# Patient Record
Sex: Female | Born: 1940 | Race: Black or African American | Hispanic: No | Smoking: Never smoker
Health system: Southern US, Community
[De-identification: ages and names within clinical notes are randomized; demographics above are authoritative.]

## PROBLEM LIST (undated history)

## (undated) DIAGNOSIS — C801 Malignant (primary) neoplasm, unspecified: Secondary | ICD-10-CM

## (undated) HISTORY — PX: CYST EXCISION: SHX5701

---

## 1999-08-03 ENCOUNTER — Emergency Department (HOSPITAL_COMMUNITY): Admission: EM | Admit: 1999-08-03 | Discharge: 1999-08-03 | Payer: Self-pay | Admitting: Emergency Medicine

## 1999-08-03 ENCOUNTER — Encounter: Payer: Self-pay | Admitting: Emergency Medicine

## 2001-06-05 ENCOUNTER — Ambulatory Visit (HOSPITAL_COMMUNITY): Admission: RE | Admit: 2001-06-05 | Discharge: 2001-06-05 | Payer: Self-pay | Admitting: Radiology

## 2006-05-10 ENCOUNTER — Ambulatory Visit (HOSPITAL_COMMUNITY): Admission: RE | Admit: 2006-05-10 | Discharge: 2006-05-10 | Payer: Self-pay | Admitting: Internal Medicine

## 2007-07-11 ENCOUNTER — Emergency Department (HOSPITAL_COMMUNITY): Admission: EM | Admit: 2007-07-11 | Discharge: 2007-07-11 | Payer: Self-pay | Admitting: Emergency Medicine

## 2008-05-11 ENCOUNTER — Ambulatory Visit (HOSPITAL_COMMUNITY): Admission: RE | Admit: 2008-05-11 | Discharge: 2008-05-11 | Payer: Self-pay | Admitting: Ophthalmology

## 2010-05-09 ENCOUNTER — Ambulatory Visit (HOSPITAL_COMMUNITY): Payer: Medicare Other

## 2010-05-09 ENCOUNTER — Ambulatory Visit (HOSPITAL_COMMUNITY)
Admission: RE | Admit: 2010-05-09 | Discharge: 2010-05-09 | Disposition: A | Discharge status: Home / Self Care | Payer: Medicare Other | Source: Ambulatory Visit | Attending: Ophthalmology | Admitting: Ophthalmology

## 2010-05-09 DIAGNOSIS — J45909 Unspecified asthma, uncomplicated: Secondary | ICD-10-CM | POA: Insufficient documentation

## 2010-05-09 DIAGNOSIS — H35349 Macular cyst, hole, or pseudohole, unspecified eye: Secondary | ICD-10-CM | POA: Insufficient documentation

## 2010-05-09 DIAGNOSIS — Z538 Procedure and treatment not carried out for other reasons: Secondary | ICD-10-CM | POA: Insufficient documentation

## 2010-05-09 LAB — CBC
HCT: 37.2 % (ref 36.0–46.0)
Hemoglobin: 12.1 g/dL (ref 12.0–15.0)
MCHC: 32.5 g/dL (ref 30.0–36.0)
RBC: 4.32 MIL/uL (ref 3.87–5.11)
WBC: 4.6 10*3/uL (ref 4.0–10.5)

## 2010-05-09 LAB — BASIC METABOLIC PANEL
CO2: 29 mEq/L (ref 19–32)
Calcium: 9.5 mg/dL (ref 8.4–10.5)
GFR calc Af Amer: >60 mL/min (ref >60)
Glucose, Bld: 93 mg/dL (ref 70–99)
Potassium: 4.1 mEq/L (ref 3.5–5.1)
Sodium: 143 mEq/L (ref 135–145)

## 2010-05-09 LAB — SURGICAL PCR SCREEN: MRSA, PCR: POSITIVE — AB

## 2010-06-16 LAB — CREATININE, SERUM
Creatinine, Ser: 0.7 mg/dL (ref 0.4–1.2)
GFR calc Af Amer: >60 mL/min (ref >60)

## 2010-07-11 ENCOUNTER — Ambulatory Visit (HOSPITAL_COMMUNITY)
Admission: RE | Admit: 2010-07-11 | Discharge: 2010-07-11 | Disposition: A | Discharge status: Home / Self Care | Payer: Medicare Other | Source: Ambulatory Visit | Attending: Ophthalmology | Admitting: Ophthalmology

## 2012-05-20 DIAGNOSIS — C189 Malignant neoplasm of colon, unspecified: Secondary | ICD-10-CM | POA: Diagnosis present

## 2013-04-02 ENCOUNTER — Observation Stay (HOSPITAL_COMMUNITY)
Admission: EM | Admit: 2013-04-02 | Discharge: 2013-04-03 | Disposition: A | Discharge status: Home / Self Care | Payer: Medicare Other | Attending: Internal Medicine | Admitting: Internal Medicine

## 2013-04-02 ENCOUNTER — Emergency Department (HOSPITAL_COMMUNITY): Payer: Medicare Other

## 2013-04-02 ENCOUNTER — Encounter (HOSPITAL_COMMUNITY): Payer: Self-pay | Admitting: Emergency Medicine

## 2013-04-02 DIAGNOSIS — Z86718 Personal history of other venous thrombosis and embolism: Secondary | ICD-10-CM

## 2013-04-02 DIAGNOSIS — R072 Precordial pain: Principal | ICD-10-CM | POA: Insufficient documentation

## 2013-04-02 DIAGNOSIS — C787 Secondary malignant neoplasm of liver and intrahepatic bile duct: Secondary | ICD-10-CM

## 2013-04-02 DIAGNOSIS — R0602 Shortness of breath: Secondary | ICD-10-CM | POA: Insufficient documentation

## 2013-04-02 DIAGNOSIS — R079 Chest pain, unspecified: Secondary | ICD-10-CM

## 2013-04-02 DIAGNOSIS — I82409 Acute embolism and thrombosis of unspecified deep veins of unspecified lower extremity: Secondary | ICD-10-CM

## 2013-04-02 DIAGNOSIS — C189 Malignant neoplasm of colon, unspecified: Secondary | ICD-10-CM

## 2013-04-02 DIAGNOSIS — Z79899 Other long term (current) drug therapy: Secondary | ICD-10-CM | POA: Insufficient documentation

## 2013-04-02 DIAGNOSIS — Z7901 Long term (current) use of anticoagulants: Secondary | ICD-10-CM

## 2013-04-02 DIAGNOSIS — K224 Dyskinesia of esophagus: Secondary | ICD-10-CM

## 2013-04-02 HISTORY — DX: Malignant (primary) neoplasm, unspecified: C80.1

## 2013-04-02 LAB — POCT I-STAT TROPONIN I: Troponin i, poc: 0 ng/mL (ref 0.00–0.08)

## 2013-04-02 LAB — CBC
HEMATOCRIT: 28.8 % — AB (ref 36.0–46.0)
HEMOGLOBIN: 9.4 g/dL — AB (ref 12.0–15.0)
MCH: 28.8 pg (ref 26.0–34.0)
MCHC: 32.6 g/dL (ref 30.0–36.0)
MCV: 88.3 fL (ref 78.0–100.0)
Platelets: 246 10*3/uL (ref 150–400)
RBC: 3.26 MIL/uL — ABNORMAL LOW (ref 3.87–5.11)
RDW: 14.8 % (ref 11.5–15.5)
WBC: 2.7 10*3/uL — ABNORMAL LOW (ref 4.0–10.5)

## 2013-04-02 NOTE — ED Provider Notes (Signed)
CSN: [id number]     Arrival date & time 04/02/13  2239 History   First MD Initiated Contact with Patient 04/02/13 2249     Chief Complaint  Patient presents with  . Chest Pain  . Shortness of Breath   (Consider location/radiation/quality/duration/timing/severity/associated sxs/prior Treatment) The history is provided by the patient and medical records. No language interpreter was used.    Erica Lowe is a 73 y.o. female  with a hx of colon cancer presents to the Emergency Department complaining of gradual, persistent, progressively worsening substernal chest pain and epigastric discomfort described and "gas" onset 1 hour ago.  She reports she went to chemo on Monday (chemo runs Mon-Wed) and her dose was increased this round. Associated symptoms include SOB.  EMS gave ASA and nitro with complete relief of pain and SOB.  Pt reports that walking makes her CP worse and rest makes it better.  Pt denies fever, chills, headache, nausea, vomiting, weakness, dizziness, syncope.  Pt reports that she had a similar episode of CP the last time her chemo was increased. Pt with Hx of DVT last year for which she is on Xarelto.  Pt denies personal or family cardiac hx.    Past Medical History  Diagnosis Date  . Cancer    Past Surgical History  Procedure Laterality Date  . Cyst excision     No family history on file. History  Substance Use Topics  . Smoking status: Never Smoker   . Smokeless tobacco: Not on file  . Alcohol Use: No   OB History   Grav Para Term Preterm Abortions TAB SAB Ect Mult Living                 Review of Systems  Constitutional: Negative for fever, diaphoresis, appetite change, fatigue and unexpected weight change.  HENT: Negative for mouth sores.   Eyes: Negative for visual disturbance.  Respiratory: Positive for shortness of breath. Negative for cough, chest tightness and wheezing.   Cardiovascular: Positive for chest pain.  Gastrointestinal: Negative for nausea,  vomiting, abdominal pain, diarrhea and constipation.  Endocrine: Negative for polydipsia, polyphagia and polyuria.  Genitourinary: Negative for dysuria, urgency, frequency and hematuria.  Musculoskeletal: Negative for back pain and neck stiffness.  Skin: Negative for rash.  Allergic/Immunologic: Negative for immunocompromised state.  Neurological: Negative for syncope, light-headedness and headaches.  Hematological: Does not bruise/bleed easily.  Psychiatric/Behavioral: Negative for sleep disturbance. The patient is not nervous/anxious.     Allergies  Review of patient's allergies indicates no known allergies.  Home Medications   Current Outpatient Rx  Name  Route  Sig  Dispense  Refill  . Ascorbic Acid (VITAMIN C PO)   Oral   Take 1 tablet by mouth daily.         . Cyanocobalamin (VITAMIN B-12 PO)   Oral   Take 1 tablet by mouth daily.         . IRON PO   Oral   Take 1 tablet by mouth daily.         . Omega-3 Fatty Acids (FISH OIL PO)   Oral   Take 1 capsule by mouth daily.         . Rivaroxaban (XARELTO) 20 MG TABS tablet   Oral   Take 20 mg by mouth daily with supper.          BP 126/75  Pulse 53  Temp(Src) 98.2 F (36.8 C) (Oral)  Resp 15  Ht 5\' 9"  (1.753  m)  Wt 137 lb (62.143 kg)  BMI 20.22 kg/m2  SpO2 100% Physical Exam  Nursing note and vitals reviewed. Constitutional: She is oriented to person, place, and time. She appears well-developed and well-nourished. No distress.  Awake, alert, nontoxic appearance  HENT:  Head: Normocephalic and atraumatic.  Mouth/Throat: Oropharynx is clear and moist. No oropharyngeal exudate.  Eyes: Conjunctivae are normal. Pupils are equal, round, and reactive to light. No scleral icterus.  Neck: Normal range of motion. Neck supple.  Cardiovascular: Normal rate, regular rhythm, normal heart sounds and intact distal pulses.   No murmur heard. No tachycardia  Pulmonary/Chest: Effort normal and breath sounds normal.  No respiratory distress. She has no wheezes.  Abdominal: Soft. Bowel sounds are normal. She exhibits no mass. There is no tenderness. There is no rebound and no guarding.  Musculoskeletal: Normal range of motion. She exhibits no edema.  Lymphadenopathy:    She has no cervical adenopathy.  Neurological: She is alert and oriented to person, place, and time. She exhibits normal muscle tone. Coordination normal.  Speech is clear and goal oriented Moves extremities without ataxia  Skin: Skin is warm and dry. She is not diaphoretic. No erythema.  Psychiatric: She has a normal mood and affect.    ED Course  Procedures (including critical care time) Labs Review Labs Reviewed  CBC - Abnormal; Notable for the following:    WBC 2.7 (*)    RBC 3.26 (*)    Hemoglobin 9.4 (*)    HCT 28.8 (*)    All other components within normal limits  COMPREHENSIVE METABOLIC PANEL - Abnormal; Notable for the following:    Sodium 136 (*)    Glucose, Bld 102 (*)    Albumin 3.0 (*)    Alkaline Phosphatase 125 (*)    GFR calc non Af Amer 65 (*)    GFR calc Af Amer 75 (*)    All other components within normal limits  URINALYSIS, ROUTINE W REFLEX MICROSCOPIC - Abnormal; Notable for the following:    APPearance CLOUDY (*)    Leukocytes, UA SMALL (*)    All other components within normal limits  URINE MICROSCOPIC-ADD ON - Abnormal; Notable for the following:    Squamous Epithelial / LPF FEW (*)    Bacteria, UA FEW (*)    All other components within normal limits  URINE CULTURE  POCT I-STAT TROPONIN I   Imaging Review Dg Chest 2 View  04/03/2013   CLINICAL DATA:  Lower chest pain, shortness of breath  EXAM: CHEST  2 VIEW  COMPARISON:  05/09/2010  FINDINGS: Right IJ double lumen power port catheter tip mid SVC. Normal heart size and vascularity. No acute pneumonia, edema, collapse or consolidation. No effusion or pneumothorax. Trachea midline.  IMPRESSION: No active cardiopulmonary disease.   Electronically  Signed   By: Daryll Brod M.D.   On: 04/03/2013 00:20   Ct Angio Chest Pe W/cm &/or Wo Cm  04/03/2013   CLINICAL DATA:  Shortness of breath, colon cancer, chest pain  EXAM: CT ANGIOGRAPHY CHEST WITH CONTRAST  TECHNIQUE: Multidetector CT imaging of the chest was performed using the standard protocol during bolus administration of intravenous contrast. Multiplanar CT image reconstructions including MIPs were obtained to evaluate the vascular anatomy.  CONTRAST:  7mL OMNIPAQUE IOHEXOL 350 MG/ML SOLN  COMPARISON:  04/02/2013 chest x-ray  FINDINGS: Pulmonary arteries demonstrate no filling defect or significant pulmonary embolus by CTA. No thoracic aneurysm. Negative for adenopathy. Right IJ double-lumen port catheter tip  in the SVC. Normal heart size. No pericardial or pleural effusion.  Lung windows demonstrate mild emphysema. No focal airspace process, collapse or consolidation. No interstitial process or edema. No pneumothorax. Trachea and central airways are patent. Left lower lobe posterior subpleural 10 mm nodule noted, image 74. This remains indeterminate. Minor basilar atelectasis.  Included upper abdomen demonstrates hypodense lesions throughout the liver compatible with metastatic disease. Left adrenal mass noted, compatible with an adrenal metastasis.  Minor degenerative changes of the spine.  No compression fracture.  Review of the MIP images confirms the above findings.  IMPRESSION: No significant acute pulmonary embolus by CTA.  No acute intra thoracic finding  Hyperinflation with mild emphysema  10 mm posterior subpleural left lower lobe nodule, indeterminate but suspicious for a small pulmonary metastasis.  Partial imaging of hypodense hepatic lesions and a left adrenal mass concerning for metastatic disease in the upper abdomen.   Electronically Signed   By: Daryll Brod M.D.   On: 04/03/2013 01:15    EKG Interpretation   None       MDM   1. Chest pain   2. Colon cancer   3. History  of DVT (deep vein thrombosis)   4. Anticoagulant long-term use     MOSLEY STITZ presents with exertional CP onset 1 hour PTA.  Pt with active colon cancer and Hx of DVT currently on Xarelto.  Pt without personal or family cardiac Hx.    12:48 AM Initial troponin negative, CBC and CMP unremarkable.  CT angio chest pending to rule out PE.  Anticipate admission for cardiac rule out.    1:54 AM Pt without evidence of PE.  Noted likely mets to the liver and lungs.  Will proceed with admission for cardiac rule out.  Discussed with Dr. Posey Pronto of [facility].         Jarrett Soho Edwar Coe, PA-C 04/03/13 [phone]

## 2013-04-02 NOTE — ED Notes (Signed)
Pt aware she needs to give urine sample

## 2013-04-02 NOTE — ED Notes (Signed)
Per ems, pt from home, about 2130 pt had central chest pain, described it as "tiring" that was centrally located. 9/10 pain Denies radiating anywhere. Also c/o SOB. Pt has hx of colon cancer, receieved chemo this morning, states she had a similar episode like this last time she had chemo. Pt was given 1 nitro, 324 asa, currently denies pain or SOB at this time. Pt AAOx4, denies pain, denies SOB. Denies n/v/d. VSS

## 2013-04-03 ENCOUNTER — Emergency Department (HOSPITAL_COMMUNITY): Payer: Medicare Other

## 2013-04-03 ENCOUNTER — Encounter (HOSPITAL_COMMUNITY): Payer: Self-pay | Admitting: Radiology

## 2013-04-03 DIAGNOSIS — C787 Secondary malignant neoplasm of liver and intrahepatic bile duct: Secondary | ICD-10-CM | POA: Diagnosis present

## 2013-04-03 DIAGNOSIS — K224 Dyskinesia of esophagus: Secondary | ICD-10-CM

## 2013-04-03 DIAGNOSIS — R079 Chest pain, unspecified: Secondary | ICD-10-CM

## 2013-04-03 DIAGNOSIS — Z86718 Personal history of other venous thrombosis and embolism: Secondary | ICD-10-CM

## 2013-04-03 DIAGNOSIS — I82409 Acute embolism and thrombosis of unspecified deep veins of unspecified lower extremity: Secondary | ICD-10-CM | POA: Diagnosis present

## 2013-04-03 DIAGNOSIS — C189 Malignant neoplasm of colon, unspecified: Secondary | ICD-10-CM

## 2013-04-03 LAB — COMPREHENSIVE METABOLIC PANEL
ALBUMIN: 2.9 g/dL — AB (ref 3.5–5.2)
ALK PHOS: 125 U/L — AB (ref 39–117)
ALK PHOS: 123 U/L — AB (ref 39–117)
ALT: 14 U/L (ref 0–35)
ALT: 13 U/L (ref 0–35)
AST: 22 U/L (ref 0–37)
AST: 19 U/L (ref 0–37)
Albumin: 3 g/dL — ABNORMAL LOW (ref 3.5–5.2)
BILIRUBIN TOTAL: 0.3 mg/dL (ref 0.3–1.2)
BUN: 15 mg/dL (ref 6–23)
BUN: 15 mg/dL (ref 6–23)
CALCIUM: 9.3 mg/dL (ref 8.4–10.5)
CO2: 26 meq/L (ref 19–32)
CO2: 28 mEq/L (ref 19–32)
CREATININE: 0.88 mg/dL (ref 0.50–1.10)
Calcium: 9.4 mg/dL (ref 8.4–10.5)
Chloride: 97 mEq/L (ref 96–112)
Chloride: 101 mEq/L (ref 96–112)
Creatinine, Ser: 0.87 mg/dL (ref 0.50–1.10)
GFR calc Af Amer: 74 mL/min — ABNORMAL LOW (ref >90)
GFR calc non Af Amer: 64 mL/min — ABNORMAL LOW (ref >90)
GFR, EST AFRICAN AMERICAN: 75 mL/min — AB (ref >90)
GFR, EST NON AFRICAN AMERICAN: 65 mL/min — AB (ref >90)
GLUCOSE: 102 mg/dL — AB (ref 70–99)
GLUCOSE: 103 mg/dL — AB (ref 70–99)
POTASSIUM: 3.8 meq/L (ref 3.7–5.3)
Potassium: 4.1 mEq/L (ref 3.7–5.3)
Sodium: 136 mEq/L — ABNORMAL LOW (ref 137–147)
Sodium: 142 mEq/L (ref 137–147)
TOTAL PROTEIN: 7 g/dL (ref 6.0–8.3)
Total Bilirubin: 0.3 mg/dL (ref 0.3–1.2)
Total Protein: 6.9 g/dL (ref 6.0–8.3)

## 2013-04-03 LAB — URINE MICROSCOPIC-ADD ON

## 2013-04-03 LAB — URINALYSIS, ROUTINE W REFLEX MICROSCOPIC
Bilirubin Urine: NEGATIVE
Glucose, UA: NEGATIVE mg/dL
Hgb urine dipstick: NEGATIVE
KETONES UR: NEGATIVE mg/dL
NITRITE: NEGATIVE
PH: 7 (ref 5.0–8.0)
PROTEIN: NEGATIVE mg/dL
Specific Gravity, Urine: 1.007 (ref 1.005–1.030)
UROBILINOGEN UA: 0.2 mg/dL (ref 0.0–1.0)

## 2013-04-03 LAB — CBC WITH DIFFERENTIAL/PLATELET
BASOS ABS: 0 10*3/uL (ref 0.0–0.1)
BASOS PCT: 0 % (ref 0–1)
Eosinophils Absolute: 0.1 10*3/uL (ref 0.0–0.7)
Eosinophils Relative: 2 % (ref 0–5)
HEMATOCRIT: 28.7 % — AB (ref 36.0–46.0)
HEMOGLOBIN: 9.3 g/dL — AB (ref 12.0–15.0)
LYMPHS ABS: 1.5 10*3/uL (ref 0.7–4.0)
LYMPHS PCT: 65 % — AB (ref 12–46)
MCH: 28.9 pg (ref 26.0–34.0)
MCHC: 32.4 g/dL (ref 30.0–36.0)
MCV: 89.1 fL (ref 78.0–100.0)
MONO ABS: 0.1 10*3/uL (ref 0.1–1.0)
Monocytes Relative: 2 % — ABNORMAL LOW (ref 3–12)
NEUTROS ABS: 0.8 10*3/uL — AB (ref 1.7–7.7)
Neutrophils Relative %: 31 % — ABNORMAL LOW (ref 43–77)
Platelets: 253 10*3/uL (ref 150–400)
RBC: 3.22 MIL/uL — ABNORMAL LOW (ref 3.87–5.11)
RDW: 15 % (ref 11.5–15.5)
WBC: 2.5 10*3/uL — ABNORMAL LOW (ref 4.0–10.5)

## 2013-04-03 LAB — MRSA PCR SCREENING: MRSA by PCR: NEGATIVE

## 2013-04-03 LAB — TROPONIN I: Troponin I: <0.3 ng/mL (ref <0.30)

## 2013-04-03 LAB — POCT I-STAT TROPONIN I: Troponin i, poc: 0.01 ng/mL (ref 0.00–0.08)

## 2013-04-03 MED ORDER — RIVAROXABAN 20 MG PO TABS
20.0000 mg | ORAL_TABLET | Freq: Every day | ORAL | Status: DC
  Filled 2013-04-03: qty 1

## 2013-04-03 MED ORDER — IOHEXOL 350 MG/ML SOLN
80.0000 mL | Freq: Once | INTRAVENOUS | Status: AC | PRN
  Administered 2013-04-03: 80 mL via INTRAVENOUS

## 2013-04-03 MED ORDER — ONDANSETRON HCL 4 MG/2ML IJ SOLN: 4.0000 mg | Freq: Four times a day (QID) | INTRAMUSCULAR | Status: DC | PRN

## 2013-04-03 MED ORDER — GI COCKTAIL ~~LOC~~: 30.0000 mL | Freq: Four times a day (QID) | ORAL | Status: DC | PRN

## 2013-04-03 MED ORDER — ONDANSETRON HCL 4 MG/2ML IJ SOLN: 4.0000 mg | Freq: Three times a day (TID) | INTRAMUSCULAR | Status: AC | PRN

## 2013-04-03 MED ORDER — ACETAMINOPHEN 325 MG PO TABS: 650.0000 mg | ORAL_TABLET | ORAL | Status: DC | PRN

## 2013-04-03 MED ORDER — NITROGLYCERIN 0.4 MG/SPRAY TL SOLN: 1.0000 | Status: AC | PRN

## 2013-04-03 NOTE — Consult Note (Signed)
CONSULT NOTE  Date: 04/03/2013               Patient Name:  Erica Lowe MRN: [id number]  DOB: [birth year] Age / Sex: 73 y.o., female        PCP: Salena Saner Primary Cardiologist: New/ Kimbley Sprague            Referring Physician: Eulogio Bear, MD              Reason for Consult:  chest discomfort following chemotherapy            History of Present Illness: Patient is a 73 y.o. female with a PMHx of colon cancer with metastasis to liver, who was admitted to [facility] on 04/02/2013 for evaluation of chest discomfort.   Erica Lowe is a very pleasant 73 year old female.  She was diagnosed with metastatic colon cancer in March, 2013.  She is seen by the doctors in [place] at [facility] so we do not have many records of her past medical history.  She has never had any cardiac problems. Before being diagnosed with colon cancer she typically would walk 2 miles a day and was extremely healthy. She was diagnosed with colon cancer and started chemotherapy. She's had some palpitations with chemotherapy in the past and on several occasions has had chest discomfort/indigestion following chemotherapy.  She feels much better this morning. She's eating breakfast without complications.  Medications: Outpatient medications: Prescriptions prior to admission  Medication Sig Dispense Refill  . Ascorbic Acid (VITAMIN C PO) Take 1 tablet by mouth daily.      . Cyanocobalamin (VITAMIN B-12 PO) Take 1 tablet by mouth daily.      . IRON PO Take 1 tablet by mouth daily.      . Omega-3 Fatty Acids (FISH OIL PO) Take 1 capsule by mouth daily.      . Rivaroxaban (XARELTO) 20 MG TABS tablet Take 20 mg by mouth daily with supper.        Current medications: Current Facility-Administered Medications  Medication Dose Route Frequency Provider Last Rate Last Dose  . acetaminophen (TYLENOL) tablet 650 mg  650 mg Oral Q4H PRN Berle Mull, MD      . gi cocktail (Maalox,Lidocaine,Donnatal)  30 mL  Oral QID PRN Berle Mull, MD      . ondansetron [facility]) injection 4 mg  4 mg Intravenous Q8H PRN Hannah Muthersbaugh, PA-C      . ondansetron (ZOFRAN) injection 4 mg  4 mg Intravenous Q6H PRN Berle Mull, MD      . Rivaroxaban (XARELTO) tablet 20 mg  20 mg Oral Q supper Berle Mull, MD         No Known Allergies   Past Medical History  Diagnosis Date  . Cancer     Past Surgical History  Procedure Laterality Date  . Cyst excision      No family history on file.  Social History:  reports that she has never smoked. She does not have any smokeless tobacco history on file. She reports that she does not drink alcohol. Her drug history is not on file.   Review of Systems: Constitutional:  denies fever, chills, diaphoresis,   She does have decreased appetite, weight loss and fatigue.  HEENT: denies photophobia, eye pain, redness, hearing loss, ear pain, congestion, sore throat, rhinorrhea, sneezing, neck pain, neck stiffness and tinnitus.  Respiratory: denies SOB, DOE, cough, chest tightness,   Cardiovascular: admits to chest pain,  Gastrointestinal: admits to nausea, vomiting, abdominal pain, diarrhea,   Genitourinary: denies dysuria, urgency, frequency, hematuria, flank pain and difficulty urinating.  Musculoskeletal: denies  myalgias, back pain, joint swelling, arthralgias and gait problem.   Skin: denies pallor, rash and wound.  Neurological: denies dizziness, seizures, syncope, weakness, light-headedness, numbness and headaches.   Hematological: denies adenopathy, easy bruising, personal or family bleeding history.  Psychiatric/ Behavioral: denies suicidal ideation, mood changes, confusion, nervousness, sleep disturbance and agitation.    Physical Exam: BP 97/69  Pulse 70  Temp(Src) 98.7 F (37.1 C) (Oral)  Resp 18  Ht 5\' 9"  (1.753 m)  Wt 134 lb 4.8 oz (60.918 kg)  BMI 19.82 kg/m2  SpO2 100%  General: Vital signs reviewed and noted. Well-developed, well-nourished,  in no acute distress; alert,   Head: Normocephalic, atraumatic, sclera anicteric,   Neck: Supple. Negative for carotid bruits. No JVD ,  She has a Port-a-cath in her right subclavian area.  Lungs:  Clear bilaterally, no  wheezes, rales, or rhonchi. Breathing is normal   Heart: RRR with S1 S2. No murmurs, rubs, or gallops   Abdomen:  Soft, non-tender, non-distended with normoactive bowel sounds. No hepatomegaly. No rebound/guarding. No obvious abdominal masses   MSK: Strength and the appear normal for age.   Extremities: No clubbing or cyanosis. No edema.  Distal pedal pulses are 2+ and equal   Neurologic: Alert and oriented X 3. Moves all extremities spontaneously.  Psych: Responds to questions appropriately with a normal affect.     Lab results: Basic Metabolic Panel:  Recent Labs Lab 04/02/13 2315  NA 136*  K 3.8  CL 97  CO2 26  GLUCOSE 102*  BUN 15  CREATININE 0.87  CALCIUM 9.4    Liver Function Tests:  Recent Labs Lab 04/02/13 2315  AST 22  ALT 14  ALKPHOS 125*  BILITOT 0.3  PROT 7.0  ALBUMIN 3.0*   No results found for this basename: LIPASE, AMYLASE,  in the last 168 hours No results found for this basename: AMMONIA,  in the last 168 hours  CBC:  Recent Labs Lab 04/02/13 2315  WBC 2.7*  HGB 9.4*  HCT 28.8*  MCV 88.3  PLT 246    Cardiac Enzymes:  Recent Labs Lab 04/03/13 0255  TROPONINI <0.30    BNP: No components found with this basename: POCBNP,   CBG: No results found for this basename: GLUCAP,  in the last 168 hours  Coagulation Studies: No results found for this basename: LABPROT, INR,  in the last 72 hours   Other results:  EKG NSR, no ST or T wave change   Imaging: Dg Chest 2 View  04/03/2013   CLINICAL DATA:  Lower chest pain, shortness of breath  EXAM: CHEST  2 VIEW  COMPARISON:  05/09/2010  FINDINGS: Right IJ double lumen power port catheter tip mid SVC. Normal heart size and vascularity. No acute pneumonia, edema, collapse  or consolidation. No effusion or pneumothorax. Trachea midline.  IMPRESSION: No active cardiopulmonary disease.   Electronically Signed   By: Daryll Brod M.D.   On: 04/03/2013 00:20   Ct Angio Chest Pe W/cm &/or Wo Cm  04/03/2013   CLINICAL DATA:  Shortness of breath, colon cancer, chest pain  EXAM: CT ANGIOGRAPHY CHEST WITH CONTRAST  TECHNIQUE: Multidetector CT imaging of the chest was performed using the standard protocol during bolus administration of intravenous contrast. Multiplanar CT image reconstructions including MIPs were obtained to evaluate the vascular anatomy.  CONTRAST:  72mL  OMNIPAQUE IOHEXOL 350 MG/ML SOLN  COMPARISON:  04/02/2013 chest x-ray  FINDINGS: Pulmonary arteries demonstrate no filling defect or significant pulmonary embolus by CTA. No thoracic aneurysm. Negative for adenopathy. Right IJ double-lumen port catheter tip in the SVC. Normal heart size. No pericardial or pleural effusion.  Lung windows demonstrate mild emphysema. No focal airspace process, collapse or consolidation. No interstitial process or edema. No pneumothorax. Trachea and central airways are patent. Left lower lobe posterior subpleural 10 mm nodule noted, image 74. This remains indeterminate. Minor basilar atelectasis.  Included upper abdomen demonstrates hypodense lesions throughout the liver compatible with metastatic disease. Left adrenal mass noted, compatible with an adrenal metastasis.  Minor degenerative changes of the spine.  No compression fracture.  Review of the MIP images confirms the above findings.  IMPRESSION: No significant acute pulmonary embolus by CTA.  No acute intra thoracic finding  Hyperinflation with mild emphysema  10 mm posterior subpleural left lower lobe nodule, indeterminate but suspicious for a small pulmonary metastasis.  Partial imaging of hypodense hepatic lesions and a left adrenal mass concerning for metastatic disease in the upper abdomen.   Electronically Signed   By: Daryll Brod  M.D.   On: 04/03/2013 01:15       Assessment & Plan:  1. Chest pain: The patient has very atypical chest pains. The episode sounded more like indigestion following her chemotherapy treatment. She had a very similar episode when she received chemotherapy 2 months ago. She has a normal EKG. Her troponin levels were normal. The pains have completely resolved this morning.  I suspect that these chest pains are due to  her chemotherapy.  Prior to her diagnosis of colon cancer she was very active and walks 2 miles a day without palpitations.  She's not having any ongoing pain and has no EKG changes. Given the fact that she has metastatic colon cancer, am not sure that further cardiac workup would change her prognosis. Certainly, if she starts having chest pain and significant EKG   changes consistent with ischemia then we could perform a cath and angioplasty but I don't think that further evaluation and cardiac catheterization at this point would necessarily change our approach to her care.  2. Metastatic colon cancer: The patient sees the doctors at [facility]. Continue to followup with him.  We'll sign off. Please call for further questions.  Thayer Headings, Brooke Bonito., MD, [facility] 04/03/2013, 8:43 AM Office - [phone] Pager [phone]

## 2013-04-03 NOTE — ED Provider Notes (Signed)
Medical screening examination/treatment/procedure(s) were conducted as a shared visit with non-physician practitioner(s) and myself.  I personally evaluated the patient during the encounter.  EKG Interpretation    Date/Time:    Ventricular Rate:    PR Interval:    QRS Duration:   QT Interval:    QTC Calculation:   R Axis:     Text Interpretation:             Patient evaluated for chest pain, felt like gas, with associated shortness of breath. Pain resolved prior to arrival.   Heart regular rate and rhythm lungs sounds clear and equal. No lower extremity edema.  EKG reviewed. Labs and imaging obtained. Plan medical admission.  Teressa Lower, MD 04/03/13 [phone]

## 2013-04-03 NOTE — Progress Notes (Signed)
Pt came in this morning from the ed with initial complaints of chest pain currently pain free and was admitted to 3w26, pt is alert x4, vital signs are stable, SB on the monitor, HR=58.  Admission hx and assessment has been done, care plan has been initiated. Will continue to monitor pt.---Long Brimage, rn

## 2013-04-03 NOTE — ED Notes (Signed)
Pt given turkey sandwich per PA 

## 2013-04-03 NOTE — Discharge Summary (Signed)
Physician Discharge Summary  Erica Lowe ZYS:[id number] DOB: 1941/01/28 DOA: 04/02/2013  PCP: Salena Saner., MD  Admit date: 04/02/2013 Discharge date: 04/03/2013  Time spent: 35 minutes    Discharge Diagnoses:  Principal Problem:   Chest pain Active Problems:   Metastatic colon cancer to liver   DVT (deep venous thrombosis)   Discharge Condition: improved  Diet recommendation: cardiac  Filed Weights   04/02/13 2258 04/03/13 0319  Weight: 62.143 kg (137 lb) 60.918 kg (134 lb 4.8 oz)    History of present illness:  Erica Lowe is a 73 y.o. female with Past medical history of metastatic colon cancer with metastases to liver .  The patient is coming from home.  The patient mentions that she was at her baseline until this afternoon. She was started on a new chemotherapy regimen due to progression of her disease in liver 2 days ago. Her regimen included cetuximab and Folfiri.  this afternoon she started having complaining of substernal and epigastric pain which she describes as gas that she couldn't get rid of.  The pain lasted for nearly one hour and progressively got worse and therefore she called EMS. EMS gave her nitroglycerin with aspirin and she had complete relief of her pain.  She denies any prior cardiac history in mentions that she had similar pain with her chemotherapy during the first treatment in a milder intensity.  She reported to 61 physician that she had some shortness of breath and exertional worsening of her symptoms which she denied to me.  Pt denies any fever, chills, headache, cough, orthopnea, PND, nausea, vomiting, abdominal pain, diarrhea, constipation, active bleeding, burning urination, dizziness, pedal edema, focal neurological deficit.    Hospital Course:  Chest pain: The patient has very atypical chest pains. The episode sounded more like indigestion/esophageal spasm following her chemotherapy treatment. She had a very similar episode when she  received chemotherapy 2 months ago. She has a normal EKG. Her troponin levels were normal. The pains have completely resolved after nitro I suspect that these chest pains are due to her chemotherapy. Prior to her diagnosis of colon cancer she was very active and walks 2 miles a day without palpitations.  She's not having any ongoing pain and has no EKG changes. Given the fact that she has metastatic colon cancer, am not sure that further cardiac workup would change her prognosis. Certainly, if she starts having chest pain and significant EKG changes consistent with ischemia then we could perform a cath and angioplasty but don't think that further evaluation and cardiac catheterization at this point would necessarily change our approach to her care.    Metastatic colon cancer: The patient sees the doctors at [facility]. Continue to followup with him   Procedures: None  Consultations:  cards  Discharge Exam: Filed Vitals:   04/03/13 1151  BP: 104/62  Pulse: 68  Temp: 98.6 F (37 C)  Resp: 18    General: A+Ox3, NAD Cardiovascular: rrr Respiratory: clear anterior  Discharge Instructions      Discharge Orders   Future Orders Complete By Expires   Diet - low sodium heart healthy  As directed    Discharge instructions  As directed    Comments:     Be cautious with nitro sprat as can lower BP   Increase activity slowly  As directed        Medication List         FISH OIL PO  Take 1 capsule by mouth daily.  IRON PO  Take 1 tablet by mouth daily.     nitroGLYCERIN 0.4 MG/SPRAY spray  Commonly known as:  NITROLINGUAL  Place 1 spray under the tongue every 5 (five) minutes x 3 doses as needed for chest pain.     VITAMIN B-12 PO  Take 1 tablet by mouth daily.     VITAMIN C PO  Take 1 tablet by mouth daily.     XARELTO 20 MG Tabs tablet  Generic drug:  Rivaroxaban  Take 20 mg by mouth daily with supper.       No Known Allergies    The results of  significant diagnostics from this hospitalization (including imaging, microbiology, ancillary and laboratory) are listed below for reference.    Significant Diagnostic Studies: Dg Chest 2 View  04/03/2013   CLINICAL DATA:  Lower chest pain, shortness of breath  EXAM: CHEST  2 VIEW  COMPARISON:  05/09/2010  FINDINGS: Right IJ double lumen power port catheter tip mid SVC. Normal heart size and vascularity. No acute pneumonia, edema, collapse or consolidation. No effusion or pneumothorax. Trachea midline.  IMPRESSION: No active cardiopulmonary disease.   Electronically Signed   By: Daryll Brod M.D.   On: 04/03/2013 00:20   Ct Angio Chest Pe W/cm &/or Wo Cm  04/03/2013   CLINICAL DATA:  Shortness of breath, colon cancer, chest pain  EXAM: CT ANGIOGRAPHY CHEST WITH CONTRAST  TECHNIQUE: Multidetector CT imaging of the chest was performed using the standard protocol during bolus administration of intravenous contrast. Multiplanar CT image reconstructions including MIPs were obtained to evaluate the vascular anatomy.  CONTRAST:  45mL OMNIPAQUE IOHEXOL 350 MG/ML SOLN  COMPARISON:  04/02/2013 chest x-ray  FINDINGS: Pulmonary arteries demonstrate no filling defect or significant pulmonary embolus by CTA. No thoracic aneurysm. Negative for adenopathy. Right IJ double-lumen port catheter tip in the SVC. Normal heart size. No pericardial or pleural effusion.  Lung windows demonstrate mild emphysema. No focal airspace process, collapse or consolidation. No interstitial process or edema. No pneumothorax. Trachea and central airways are patent. Left lower lobe posterior subpleural 10 mm nodule noted, image 74. This remains indeterminate. Minor basilar atelectasis.  Included upper abdomen demonstrates hypodense lesions throughout the liver compatible with metastatic disease. Left adrenal mass noted, compatible with an adrenal metastasis.  Minor degenerative changes of the spine.  No compression fracture.  Review of the MIP  images confirms the above findings.  IMPRESSION: No significant acute pulmonary embolus by CTA.  No acute intra thoracic finding  Hyperinflation with mild emphysema  10 mm posterior subpleural left lower lobe nodule, indeterminate but suspicious for a small pulmonary metastasis.  Partial imaging of hypodense hepatic lesions and a left adrenal mass concerning for metastatic disease in the upper abdomen.   Electronically Signed   By: Daryll Brod M.D.   On: 04/03/2013 01:15    Microbiology: Recent Results (from the past 240 hour(s))  MRSA PCR SCREENING     Status: None   Collection Time    04/03/13  4:18 AM      Result Value Range Status   MRSA by PCR NEGATIVE  NEGATIVE Final   Comment:            The GeneXpert MRSA Assay (FDA     approved for NASAL specimens     only), is one component of a     comprehensive MRSA colonization     surveillance program. It is not     intended to diagnose MRSA  infection nor to guide or     monitor treatment for     MRSA infections.     Labs: Basic Metabolic Panel:  Recent Labs Lab 04/02/13 2315 04/03/13 0905  NA 136* 142  K 3.8 4.1  CL 97 101  CO2 26 28  GLUCOSE 102* 103*  BUN 15 15  CREATININE 0.87 0.88  CALCIUM 9.4 9.3   Liver Function Tests:  Recent Labs Lab 04/02/13 2315 04/03/13 0905  AST 22 19  ALT 14 13  ALKPHOS 125* 123*  BILITOT 0.3 0.3  PROT 7.0 6.9  ALBUMIN 3.0* 2.9*   No results found for this basename: LIPASE, AMYLASE,  in the last 168 hours No results found for this basename: AMMONIA,  in the last 168 hours CBC:  Recent Labs Lab 04/02/13 2315 04/03/13 0905  WBC 2.7* 2.5*  NEUTROABS  --  0.8*  HGB 9.4* 9.3*  HCT 28.8* 28.7*  MCV 88.3 89.1  PLT 246 253   Cardiac Enzymes:  Recent Labs Lab 04/03/13 0255 04/03/13 0905  TROPONINI <0.30 <0.30   BNP: BNP (last 3 results) No results found for this basename: PROBNP,  in the last 8760 hours CBG: No results found for this basename: GLUCAP,  in the last  168 hours     Signed:  Eulogio Bear  [facility] 04/03/2013, 2:30 PM

## 2013-04-03 NOTE — ED Notes (Signed)
Attempted to call report, nurse unavailable.

## 2013-04-03 NOTE — Discharge Instructions (Signed)
Aspirin and Your Heart Aspirin affects the way your blood clots and helps "thin" the blood. Aspirin has many uses in heart disease. It may be used as a primary prevention to help reduce the risk of heart related events. It also can be used as a secondary measure to prevent more heart attacks or to prevent additional damage from blood clots.  ASPIRIN MAY HELP IF YOU:  Have had a heart attack or chest pain.  Have undergone open heart surgery such as CABG (Coronary Artery Bypass Surgery).  Have had coronary angioplasty with or without stents.  Have experienced a stroke or TIA (transient ischemic attack).  Have peripheral vascular disease (PAD).  Have chronic heart rhythm problems such as atrial fibrillation.  Are at risk for heart disease. BEFORE STARTING ASPIRIN Before you start taking aspirin, your caregiver will need to review your medical history. Many things will need to be taken into consideration, such as:  Smoking status.  Blood pressure.  Diabetes.  Gender.  Weight.  Cholesterol level. ASPIRIN DOSES  Aspirin should only be taken on the advice of your caregiver. Talk to your caregiver about how much aspirin you should take. Aspirin comes in different doses such as:  81 mg.  162 mg.  325 mg.  The aspirin dose you take may be affected by many factors, some of which include:  Your current medications, especially if your are taking blood-thinners or anti-platelet medicine.  Liver function.  Heart disease risk.  Age.  Aspirin comes in two forms:  Non-enteric-coated. This type of aspirin does not have a coating and is absorbed faster. Non-enteric coated aspirin is recommended for patients experiencing chest pain symptoms. This type of aspirin also comes in a chewable form.  Enteric-coated. This means the aspirin has a special coating that releases the medicine very slowly. Enteric-coated aspirin causes less stomach upset. This type of aspirin should not be chewed  or crushed. ASPIRIN SIDE EFFECTS Daily use of aspirin can increase your risk of serious side effects. Some of these include:  Increased bleeding. This can range from a cut that does not stop bleeding to more serious problems such as stomach bleeding or bleeding into the brain (Intracerebral bleeding).  Increased bruising.  Stomach upset.  An allergic reaction such as red, itchy skin.  Increased risk of bleeding when combined with non-steroidal anti-inflammatory medicine (NSAIDS).  Alcohol should be drank in moderation when taking aspirin. Alcohol can increase the risk of stomach bleeding when taken with aspirin.  Aspirin should not be given to children less than 29 years of age due to the association of Reye syndrome. Reye syndrome is a serious illness that can affect the brain and liver. Studies have linked Reye syndrome with aspirin use in children.  People that have nasal polyps have an increased risk of developing an aspirin allergy. SEEK MEDICAL CARE IF:   You develop an allergic reaction such as:  Hives.  Itchy skin.  Swelling of the lips, tongue or face.  You develop stomach pain.  You have unusual bleeding or bruising.  You have ringing in your ears. SEEK IMMEDIATE MEDICAL CARE IF:   You have severe chest pain, especially if the pain is crushing or pressure-like and spreads to the arms, back, neck, or jaw. THIS IS AN EMERGENCY. Do not wait to see if the pain will go away. Get medical help at once. Call your local emergency services (911 in the [place] DO NOT drive yourself to the hospital.  You have stroke-like symptoms  and spreads to the arms, back, neck, or jaw. THIS IS AN EMERGENCY. Do not wait to see if the pain will go away. Get medical help at once. Call your local emergency services (911 in the [place] DO NOT drive yourself to the hospital.   You have stroke-like symptoms such as:   Loss of vision.   Difficulty talking.   Numbness or weakness on one side of your body.   Numbness or weakness in your arm or leg.    Not thinking clearly or feeling confused.   Your bowel movements are bloody, dark red or black in color.   You vomit or cough up blood.   You have blood in your urine.   You have shortness of breath, coughing or wheezing.  MAKE SURE YOU:    Understand these instructions.   Will monitor your condition.   Seek immediate medical care if necessary.  Document Released: 02/03/2008 Document Revised: 06/17/2012 Document Reviewed: 02/03/2008  [employer] Patient Information 2014 [employer].

## 2013-04-03 NOTE — Progress Notes (Signed)
DC orders received.  Patient stable with no S/S of distress.  Medication and discharge information reviewed with patient and patient's son.  Patient DC home with son. Erica Lowe  

## 2013-04-03 NOTE — H&P (Signed)
[facility] History and Physical  Patient: Erica Lowe  IOE:[id number] DOB: 1940-08-01  DOS: the patient was seen and examined on 04/03/2013 PCP: Salena Saner., MD  Chief Complaint: Chest pain  HPI: Erica Lowe is a 73 y.o. female with Past medical history of metastatic colon cancer with metastases to liver . The patient is coming from home. The patient mentions that she was at her baseline until this afternoon. She was started on a new chemotherapy regimen due to progression of her disease in liver 2 days ago. Her regimen included cetuximab and Folfiri.  this afternoon she started having complaining of substernal and epigastric pain which she describes as gas that she couldn't get rid of. The pain lasted for nearly one hour and progressively got worse and therefore she called EMS. EMS gave her nitroglycerin with aspirin and she had complete relief of her pain. She denies any prior cardiac history in mentions that she had similar pain with her chemotherapy during the first treatment in a milder intensity. She reported to 52 physician that she had some shortness of breath and exertional worsening of her symptoms which she denied to me. Pt denies any fever, chills, headache, cough, orthopnea, PND, nausea, vomiting, abdominal pain, diarrhea, constipation, active bleeding, burning urination, dizziness, pedal edema,  focal neurological deficit.   Review of Systems: as mentioned in the history of present illness.  A Comprehensive review of the other systems is negative.  Past Medical History  Diagnosis Date  . Cancer    Past Surgical History  Procedure Laterality Date  . Cyst excision     Social History:  reports that she has never smoked. She does not have any smokeless tobacco history on file. She reports that she does not drink alcohol. Her drug history is not on file. Independent for most of her  ADL.  No Known Allergies  No family history on file.  Prior to  Admission medications   Medication Sig Start Date End Date Taking? Authorizing Provider  Ascorbic Acid (VITAMIN C PO) Take 1 tablet by mouth daily.   Yes Historical Provider, MD  Cyanocobalamin (VITAMIN B-12 PO) Take 1 tablet by mouth daily.   Yes Historical Provider, MD  IRON PO Take 1 tablet by mouth daily.   Yes Historical Provider, MD  Omega-3 Fatty Acids (FISH OIL PO) Take 1 capsule by mouth daily.   Yes Historical Provider, MD  Rivaroxaban (XARELTO) 20 MG TABS tablet Take 20 mg by mouth daily with supper.   Yes Historical Provider, MD    Physical Exam: Filed Vitals:   04/02/13 2250 04/02/13 2258 04/03/13 0006 04/03/13 0115  BP:  131/86 139/87 126/75  Pulse:  61 62 53  Temp:  98.2 F (36.8 C)    TempSrc:  Oral    Resp:  18 18 15   Height:  5\' 9"  (1.753 m)    Weight:  62.143 kg (137 lb)    SpO2: 100% 100% 100% 100%    General: Alert, Awake and Oriented to Time, Place and Person. Appear in no  distress Eyes: PERRL ENT: Oral Mucosa clear moist. Neck:  no JVD Cardiovascular: S1 and S2 Present,  no Murmur, Peripheral Pulses Present Respiratory: Bilateral Air entry equal and Decreased, Clear to Auscultation,   no Crackles, no wheezes Abdomen: Bowel Sound Present, Soft and Non tender Skin:  no Rash Extremities:  no Pedal edema,  no calf tenderness Neurologic: Grossly Unremarkable.  Labs on Admission:  CBC:  Recent Labs Lab 04/02/13 2315  WBC 2.7*  HGB 9.4*  HCT 28.8*  MCV 88.3  PLT 246    CMP     Component Value Date/Time   NA 136* 04/02/2013 2315   K 3.8 04/02/2013 2315   CL 97 04/02/2013 2315   CO2 26 04/02/2013 2315   GLUCOSE 102* 04/02/2013 2315   BUN 15 04/02/2013 2315   CREATININE 0.87 04/02/2013 2315   CALCIUM 9.4 04/02/2013 2315   PROT 7.0 04/02/2013 2315   ALBUMIN 3.0* 04/02/2013 2315   AST 22 04/02/2013 2315   ALT 14 04/02/2013 2315   ALKPHOS 125* 04/02/2013 2315   BILITOT 0.3 04/02/2013 2315   GFRNONAA 65* 04/02/2013 2315   GFRAA 75* 04/02/2013 2315    No  results found for this basename: LIPASE, AMYLASE,  in the last 168 hours No results found for this basename: AMMONIA,  in the last 168 hours  No results found for this basename: CKTOTAL, CKMB, CKMBINDEX, TROPONINI,  in the last 168 hours BNP (last 3 results) No results found for this basename: PROBNP,  in the last 8760 hours  Radiological Exams on Admission: Dg Chest 2 View  04/03/2013   CLINICAL DATA:  Lower chest pain, shortness of breath  EXAM: CHEST  2 VIEW  COMPARISON:  05/09/2010  FINDINGS: Right IJ double lumen power port catheter tip mid SVC. Normal heart size and vascularity. No acute pneumonia, edema, collapse or consolidation. No effusion or pneumothorax. Trachea midline.  IMPRESSION: No active cardiopulmonary disease.   Electronically Signed   By: Ruel Favors M.D.   On: 04/03/2013 00:20   Ct Angio Chest Pe W/cm &/or Wo Cm  04/03/2013   CLINICAL DATA:  Shortness of breath, colon cancer, chest pain  EXAM: CT ANGIOGRAPHY CHEST WITH CONTRAST  TECHNIQUE: Multidetector CT imaging of the chest was performed using the standard protocol during bolus administration of intravenous contrast. Multiplanar CT image reconstructions including MIPs were obtained to evaluate the vascular anatomy.  CONTRAST:  37mL OMNIPAQUE IOHEXOL 350 MG/ML SOLN  COMPARISON:  04/02/2013 chest x-ray  FINDINGS: Pulmonary arteries demonstrate no filling defect or significant pulmonary embolus by CTA. No thoracic aneurysm. Negative for adenopathy. Right IJ double-lumen port catheter tip in the SVC. Normal heart size. No pericardial or pleural effusion.  Lung windows demonstrate mild emphysema. No focal airspace process, collapse or consolidation. No interstitial process or edema. No pneumothorax. Trachea and central airways are patent. Left lower lobe posterior subpleural 10 mm nodule noted, image 74. This remains indeterminate. Minor basilar atelectasis.  Included upper abdomen demonstrates hypodense lesions throughout the liver  compatible with metastatic disease. Left adrenal mass noted, compatible with an adrenal metastasis.  Minor degenerative changes of the spine.  No compression fracture.  Review of the MIP images confirms the above findings.  IMPRESSION: No significant acute pulmonary embolus by CTA.  No acute intra thoracic finding  Hyperinflation with mild emphysema  10 mm posterior subpleural left lower lobe nodule, indeterminate but suspicious for a small pulmonary metastasis.  Partial imaging of hypodense hepatic lesions and a left adrenal mass concerning for metastatic disease in the upper abdomen.   Electronically Signed   By: Ruel Favors M.D.   On: 04/03/2013 01:15    EKG: Independently reviewed. normal EKG, normal sinus rhythm, nonspecific ST and T waves changes.  Assessment/Plan Principal Problem:   Chest pain Active Problems:   Metastatic colon cancer to liver   DVT (deep venous thrombosis)   1. Chest pain  the patient is presenting with complaints of  chest pain without any changes on her EKG and negative troponin. Her pain completely resolved with nitroglycerin and she is currently chest pain-free. With this she'll be admitted to rule out ACS I would obtain serial troponin and echocardiogram She is on Xarelto and does has very low probability of PE Her chest x-ray does not show pneumonia Continue to monitor on telemetry  2.Metastatic colon cancer with liver metastasis  Patient is on palliative chemotherapy  Continue to monitor  3.History of DVT   continue Xarelto  Nutrition:  cardiac diet  Code Status:  full  Disposition: Admitted to observation in telemetry unit.  Author: Berle Mull, MD [facility] Pager: [phone] 04/03/2013, 3:13 AM    If 7PM-7AM, please contact night-coverage [email or web address] Password [email or web address]

## 2013-04-04 LAB — URINE CULTURE

## 2013-04-07 LAB — PATHOLOGIST SMEAR REVIEW

## 2013-04-16 DIAGNOSIS — D649 Anemia, unspecified: Secondary | ICD-10-CM | POA: Insufficient documentation

## 2013-07-09 DIAGNOSIS — D72819 Decreased white blood cell count, unspecified: Secondary | ICD-10-CM | POA: Insufficient documentation

## 2013-07-09 DIAGNOSIS — S2249XA Multiple fractures of ribs, unspecified side, initial encounter for closed fracture: Secondary | ICD-10-CM | POA: Insufficient documentation

## 2013-07-09 DIAGNOSIS — D696 Thrombocytopenia, unspecified: Secondary | ICD-10-CM | POA: Insufficient documentation

## 2013-07-09 DIAGNOSIS — Z515 Encounter for palliative care: Secondary | ICD-10-CM | POA: Insufficient documentation

## 2014-01-05 DIAGNOSIS — K56609 Unspecified intestinal obstruction, unspecified as to partial versus complete obstruction: Secondary | ICD-10-CM | POA: Insufficient documentation

## 2014-01-20 DIAGNOSIS — Z9889 Other specified postprocedural states: Secondary | ICD-10-CM | POA: Insufficient documentation

## 2014-03-09 DIAGNOSIS — Z86718 Personal history of other venous thrombosis and embolism: Secondary | ICD-10-CM | POA: Diagnosis not present

## 2014-03-09 DIAGNOSIS — R109 Unspecified abdominal pain: Secondary | ICD-10-CM | POA: Diagnosis not present

## 2014-03-09 DIAGNOSIS — Z9221 Personal history of antineoplastic chemotherapy: Secondary | ICD-10-CM | POA: Diagnosis not present

## 2014-03-09 DIAGNOSIS — E871 Hypo-osmolality and hyponatremia: Secondary | ICD-10-CM | POA: Diagnosis not present

## 2014-03-09 DIAGNOSIS — E43 Unspecified severe protein-calorie malnutrition: Secondary | ICD-10-CM | POA: Diagnosis not present

## 2014-03-09 DIAGNOSIS — Z87891 Personal history of nicotine dependence: Secondary | ICD-10-CM | POA: Diagnosis not present

## 2014-03-09 DIAGNOSIS — R52 Pain, unspecified: Secondary | ICD-10-CM | POA: Diagnosis not present

## 2014-03-09 DIAGNOSIS — Z800 Family history of malignant neoplasm of digestive organs: Secondary | ICD-10-CM | POA: Diagnosis not present

## 2014-03-09 DIAGNOSIS — I82409 Acute embolism and thrombosis of unspecified deep veins of unspecified lower extremity: Secondary | ICD-10-CM | POA: Diagnosis not present

## 2014-03-09 DIAGNOSIS — Z681 Body mass index (BMI) 19 or less, adult: Secondary | ICD-10-CM | POA: Diagnosis not present

## 2014-03-09 DIAGNOSIS — C787 Secondary malignant neoplasm of liver and intrahepatic bile duct: Secondary | ICD-10-CM | POA: Diagnosis not present

## 2014-03-09 DIAGNOSIS — K210 Gastro-esophageal reflux disease with esophagitis: Secondary | ICD-10-CM | POA: Diagnosis not present

## 2014-03-09 DIAGNOSIS — Z91030 Bee allergy status: Secondary | ICD-10-CM | POA: Diagnosis not present

## 2014-03-09 DIAGNOSIS — C189 Malignant neoplasm of colon, unspecified: Secondary | ICD-10-CM | POA: Diagnosis not present

## 2014-03-09 DIAGNOSIS — R64 Cachexia: Secondary | ICD-10-CM | POA: Diagnosis not present

## 2014-03-09 DIAGNOSIS — E860 Dehydration: Secondary | ICD-10-CM | POA: Diagnosis not present

## 2014-03-09 DIAGNOSIS — D638 Anemia in other chronic diseases classified elsewhere: Secondary | ICD-10-CM | POA: Diagnosis not present

## 2014-03-09 DIAGNOSIS — R627 Adult failure to thrive: Secondary | ICD-10-CM | POA: Diagnosis not present

## 2014-03-09 DIAGNOSIS — G8929 Other chronic pain: Secondary | ICD-10-CM | POA: Diagnosis not present

## 2014-03-09 DIAGNOSIS — C19 Malignant neoplasm of rectosigmoid junction: Secondary | ICD-10-CM | POA: Diagnosis not present

## 2014-03-09 DIAGNOSIS — E861 Hypovolemia: Secondary | ICD-10-CM | POA: Diagnosis not present

## 2014-03-09 DIAGNOSIS — Z888 Allergy status to other drugs, medicaments and biological substances status: Secondary | ICD-10-CM | POA: Diagnosis not present

## 2014-03-09 DIAGNOSIS — R339 Retention of urine, unspecified: Secondary | ICD-10-CM | POA: Diagnosis not present

## 2014-03-18 DIAGNOSIS — I82409 Acute embolism and thrombosis of unspecified deep veins of unspecified lower extremity: Secondary | ICD-10-CM | POA: Diagnosis not present

## 2014-03-18 DIAGNOSIS — I469 Cardiac arrest, cause unspecified: Secondary | ICD-10-CM | POA: Diagnosis not present

## 2014-03-18 DIAGNOSIS — E162 Hypoglycemia, unspecified: Secondary | ICD-10-CM | POA: Diagnosis not present

## 2014-03-18 DIAGNOSIS — E43 Unspecified severe protein-calorie malnutrition: Secondary | ICD-10-CM | POA: Diagnosis not present

## 2014-03-18 DIAGNOSIS — D638 Anemia in other chronic diseases classified elsewhere: Secondary | ICD-10-CM | POA: Diagnosis not present

## 2014-03-18 DIAGNOSIS — J9601 Acute respiratory failure with hypoxia: Secondary | ICD-10-CM | POA: Diagnosis not present

## 2014-03-18 DIAGNOSIS — K210 Gastro-esophageal reflux disease with esophagitis: Secondary | ICD-10-CM | POA: Diagnosis not present

## 2014-03-18 DIAGNOSIS — C189 Malignant neoplasm of colon, unspecified: Secondary | ICD-10-CM | POA: Diagnosis not present

## 2014-03-18 DIAGNOSIS — Z7901 Long term (current) use of anticoagulants: Secondary | ICD-10-CM | POA: Diagnosis not present

## 2014-03-18 DIAGNOSIS — Z79899 Other long term (current) drug therapy: Secondary | ICD-10-CM | POA: Diagnosis not present

## 2014-03-18 DIAGNOSIS — R52 Pain, unspecified: Secondary | ICD-10-CM | POA: Diagnosis not present

## 2014-03-18 DIAGNOSIS — E872 Acidosis: Secondary | ICD-10-CM | POA: Diagnosis not present

## 2014-03-18 DIAGNOSIS — R570 Cardiogenic shock: Secondary | ICD-10-CM | POA: Diagnosis not present

## 2014-03-18 DIAGNOSIS — C787 Secondary malignant neoplasm of liver and intrahepatic bile duct: Secondary | ICD-10-CM | POA: Diagnosis not present

## 2014-03-22 ENCOUNTER — Encounter: Payer: Self-pay | Admitting: Internal Medicine

## 2014-03-22 ENCOUNTER — Non-Acute Institutional Stay (SKILLED_NURSING_FACILITY): Payer: Medicare Other | Admitting: Internal Medicine

## 2014-03-22 ENCOUNTER — Emergency Department (HOSPITAL_COMMUNITY)
Admission: EM | Admit: 2014-03-22 | Discharge: 2014-04-06 | Disposition: E | Payer: Medicare Other | Attending: Emergency Medicine | Admitting: Emergency Medicine

## 2014-03-22 ENCOUNTER — Encounter (HOSPITAL_COMMUNITY): Payer: Self-pay | Admitting: *Deleted

## 2014-03-22 DIAGNOSIS — Z7901 Long term (current) use of anticoagulants: Secondary | ICD-10-CM | POA: Insufficient documentation

## 2014-03-22 DIAGNOSIS — E162 Hypoglycemia, unspecified: Secondary | ICD-10-CM | POA: Diagnosis not present

## 2014-03-22 DIAGNOSIS — R52 Pain, unspecified: Secondary | ICD-10-CM | POA: Diagnosis not present

## 2014-03-22 DIAGNOSIS — I469 Cardiac arrest, cause unspecified: Secondary | ICD-10-CM | POA: Diagnosis not present

## 2014-03-22 DIAGNOSIS — E43 Unspecified severe protein-calorie malnutrition: Secondary | ICD-10-CM

## 2014-03-22 DIAGNOSIS — J9601 Acute respiratory failure with hypoxia: Secondary | ICD-10-CM | POA: Diagnosis not present

## 2014-03-22 DIAGNOSIS — E872 Acidosis: Secondary | ICD-10-CM | POA: Diagnosis not present

## 2014-03-22 DIAGNOSIS — C189 Malignant neoplasm of colon, unspecified: Secondary | ICD-10-CM | POA: Diagnosis not present

## 2014-03-22 DIAGNOSIS — C787 Secondary malignant neoplasm of liver and intrahepatic bile duct: Secondary | ICD-10-CM | POA: Diagnosis not present

## 2014-03-22 DIAGNOSIS — R570 Cardiogenic shock: Secondary | ICD-10-CM

## 2014-03-22 DIAGNOSIS — Z79899 Other long term (current) drug therapy: Secondary | ICD-10-CM | POA: Diagnosis not present

## 2014-03-22 DIAGNOSIS — Z515 Encounter for palliative care: Secondary | ICD-10-CM

## 2014-03-22 DIAGNOSIS — Z66 Do not resuscitate: Secondary | ICD-10-CM | POA: Diagnosis present

## 2014-03-22 LAB — I-STAT CHEM 8, ED
BUN: 54 mg/dL — ABNORMAL HIGH (ref 6–23)
CALCIUM ION: 1.29 mmol/L (ref 1.13–1.30)
CHLORIDE: 121 meq/L — AB (ref 96–112)
Creatinine, Ser: 2.4 mg/dL — ABNORMAL HIGH (ref 0.50–1.10)
Glucose, Bld: 156 mg/dL — ABNORMAL HIGH (ref 70–99)
HEMATOCRIT: 24 % — AB (ref 36.0–46.0)
Hemoglobin: 8.2 g/dL — ABNORMAL LOW (ref 12.0–15.0)
Potassium: 6.9 mmol/L (ref 3.5–5.1)
Sodium: 140 mmol/L (ref 135–145)
TCO2: 6 mmol/L (ref 0–100)

## 2014-03-22 LAB — I-STAT ARTERIAL BLOOD GAS, ED
Acid-base deficit: 27 mmol/L — ABNORMAL HIGH (ref 0.0–2.0)
Bicarbonate: 5.9 mEq/L — ABNORMAL LOW (ref 20.0–24.0)
O2 SAT: 100 %
PCO2 ART: 36.9 mmHg (ref 35.0–45.0)
PH ART: 6.811 — AB (ref 7.350–7.450)
PO2 ART: 306 mmHg — AB (ref 80.0–100.0)
Patient temperature: 98.6
TCO2: 7 mmol/L (ref 0–100)

## 2014-03-22 LAB — CBG MONITORING, ED
Glucose-Capillary: <10 mg/dL — CL (ref 70–99)
Glucose-Capillary: 104 mg/dL — ABNORMAL HIGH (ref 70–99)

## 2014-03-22 MED ORDER — NOREPINEPHRINE BITARTRATE 1 MG/ML IV SOLN
0.0000 ug/min | Freq: Once | INTRAVENOUS | Status: AC
  Administered 2014-03-22: 2 ug/min via INTRAVENOUS
  Filled 2014-03-22: qty 4

## 2014-03-22 MED ORDER — SODIUM CHLORIDE 0.9 % IV BOLUS (SEPSIS)
1000.0000 mL | Freq: Once | INTRAVENOUS | Status: AC
  Administered 2014-03-22: 1000 mL via INTRAVENOUS

## 2014-03-22 MED ORDER — DEXTROSE 50 % IV SOLN
INTRAVENOUS | Status: AC
  Administered 2014-03-22: 50 mL
  Filled 2014-03-22: qty 50

## 2014-03-22 MED ORDER — MORPHINE SULFATE 2 MG/ML IJ SOLN
INTRAMUSCULAR | Status: AC
  Administered 2014-03-22: 2 mg via INTRAVENOUS
  Filled 2014-03-22: qty 2

## 2014-03-22 MED ORDER — MORPHINE SULFATE 10 MG/ML IJ SOLN: 10.0000 mg | Freq: Once | INTRAMUSCULAR | Status: AC

## 2014-03-22 MED ORDER — MORPHINE SULFATE 2 MG/ML IJ SOLN
INTRAMUSCULAR | Status: AC
  Administered 2014-03-22: 10 mg via INTRAVENOUS
  Filled 2014-03-22: qty 5

## 2014-03-22 MED ORDER — MORPHINE SULFATE 2 MG/ML IJ SOLN
2.0000 mg | INTRAMUSCULAR | Status: DC | PRN
  Administered 2014-03-22: 2 mg via INTRAVENOUS

## 2014-04-06 NOTE — Consult Note (Signed)
Called by EDP, patient is a 74 year old female with stage four colon cancer who presents to the hospital from SNF with PEA cardiac arrest.  Upon presentation the patient is cachectic, clearly has not been doing well.  Recently discharged from [facility] with hospice.  Pupils are non-reactive.  No gag reflex present, no pupillary, no corneals and no dolls eye reflex but clearly has a respiratory drive.  After discussion, it was made clear that waiting more time will require significant amounts of procedure that will come at a high price when it comes to patient comfort and are futile and therefore will not be offered.  The family clearly stated that they do not wish for additional pain and suffering.  Based on the discussion, will make patient a full DNR (including no pressors) and will be given 10 mg of IV morphine, the king airway will not be changed as they clearly stated they wish for no more procedure and an ET tube is clearly futile care.  Once family says their goodbyes then will remove king airway, maintain comfort and patient is to be maintained as comfort care only.  If she is to survive for any meaningful duration then hospitalist can admit to the palliative care floor.  PCCM will sign off, please call back if needed.  The patient is critically ill with multiple organ systems failure and requires high complexity decision making for assessment and support, frequent evaluation and titration of therapies, application of advanced monitoring technologies and extensive interpretation of multiple databases.   Critical Care Time devoted to patient care services described in this note is  35  Minutes. This time reflects time of care of this signee Dr Jennet Maduro. This critical care time does not reflect procedure time, or teaching time or supervisory time of PA/NP/Med student/Med Resident etc but could involve care discussion time.  Rush Farmer, M.D. [facility] Pulmonary/Critical Care Medicine. Pager:  [phone]. After hours pager: [phone].

## 2014-04-06 NOTE — ED Provider Notes (Signed)
CSN: [id number]     Arrival date & time Apr 02, 2014  1054 History   First MD Initiated Contact with Patient 04/02/2014 1057     Chief Complaint  Patient presents with  . Post-CPR      The history is provided by the EMS personnel.   Patient presents for evaluation following cardiac arrest.  Level V caveat, patient is unresponsive. Patient was brought in by EMS from her nursing care for his arrest management. Per EMS reports patient was found by nursing home staff unresponsive and apneic with no pulses. Nursing home started CPR when EMS arrived they say state that the fire department had no shock advised on the defibrillator. EMS reports a rhythm of PE A. EMS continued CPR and the patient received 2 rounds of epinephrine via IO and a King airway was placed. EMS had return of spontaneous circulation and patient was transferred for further management on an epinephrine drip. Patient comes in with the most form that states that she would like all resuscitative measures done.  Past Medical History  Diagnosis Date  . Cancer    Past Surgical History  Procedure Laterality Date  . Cyst excision     History reviewed. No pertinent family history. History  Substance Use Topics  . Smoking status: Never Smoker   . Smokeless tobacco: Not on file  . Alcohol Use: No   OB History    No data available     Review of Systems  Unable to perform ROS     Allergies  Review of patient's allergies indicates no known allergies.  Home Medications   Prior to Admission medications   Medication Sig Start Date End Date Taking? Authorizing Provider  Ascorbic Acid (VITAMIN C PO) Take 1 tablet by mouth daily.    Historical Provider, MD  Cyanocobalamin (VITAMIN B-12 PO) Take 1 tablet by mouth daily.    Historical Provider, MD  IRON PO Take 1 tablet by mouth daily.    Historical Provider, MD  nitroGLYCERIN (NITROLINGUAL) 0.4 MG/SPRAY spray Place 1 spray under the tongue every 5 (five) minutes x 3 doses as needed  for chest pain. 04/03/13   Geradine Girt, DO  Omega-3 Fatty Acids (FISH OIL PO) Take 1 capsule by mouth daily.    Historical Provider, MD  Rivaroxaban (XARELTO) 20 MG TABS tablet Take 20 mg by mouth daily with supper.    Historical Provider, MD   BP 41/15 mmHg  Pulse 45  Temp(Src) 99 F (37.2 C) (Rectal)  Resp 18  Ht 5\' 5"  (1.651 m)  SpO2 99% Physical Exam  Constitutional:  Cachectic  HENT:  King airway and oropharynx, black watery fluid intermittently coming through [place] airway  Eyes:  Pupils small and unreactive bilaterally  Cardiovascular: Normal rate and regular rhythm.   Pulmonary/Chest:  Symmetric chest rise, good air movement bilaterally with bagging.  Abdominal: Soft.  Ostomy pouch and right lower quadrants with black watery fluid  Genitourinary:  Foley catheter in place with scant dark cloudy urine in bag  Musculoskeletal:  IO and right leg  Neurological:  GCS 1-1-1  Skin: Skin is warm and dry.  Psychiatric:  Unable to assess  Nursing note and vitals reviewed.   ED Course  Procedures (including critical care time) CRITICAL CARE Performed by: Quintella Reichert   Total critical care time: 65 minutes  Critical care time was exclusive of separately billable procedures and treating other patients.  Critical care was necessary to treat or prevent imminent or life-threatening deterioration.  Critical care was time spent personally by me on the following activities: development of treatment plan with patient and/or surrogate as well as nursing, discussions with consultants, evaluation of patient's response to treatment, examination of patient, obtaining history from patient or surrogate, ordering and performing treatments and interventions, ordering and review of laboratory studies, ordering and review of radiographic studies, pulse oximetry and re-evaluation of patient's condition.  Labs Review Labs Reviewed  CBG MONITORING, ED - Abnormal; Notable for the following:     Glucose-Capillary <10 (*)    All other components within normal limits  I-STAT CHEM 8, ED - Abnormal; Notable for the following:    Potassium 6.9 (*)    Chloride 121 (*)    BUN 54 (*)    Creatinine, Ser 2.40 (*)    Glucose, Bld 156 (*)    Hemoglobin 8.2 (*)    HCT 24.0 (*)    All other components within normal limits  I-STAT ARTERIAL BLOOD GAS, ED - Abnormal; Notable for the following:    pH, Arterial 6.811 (*)    pO2, Arterial 306.0 (*)    Bicarbonate 5.9 (*)    Acid-base deficit 27.0 (*)    All other components within normal limits  CBG MONITORING, ED - Abnormal; Notable for the following:    Glucose-Capillary 104 (*)    All other components within normal limits    Imaging Review No results found.   EKG Interpretation   Date/Time:  04/02/14 10:59:48 EST Ventricular Rate:  95 PR Interval:  168 QRS Duration: 97 QT Interval:  326 QTC Calculation: 410 R Axis:   70 Text Interpretation:  Sinus rhythm Low voltage, extremity and precordial  leads Nonspecific repolarization abnormalities Baseline wander in lead(s)  V1 V2 Confirmed by Hazle Coca [id number]) on 04-02-14 11:57:57 AM      MDM   Final diagnoses:  Cardiac arrest  Metastatic colon cancer to liver  Hypoglycemia    Patient here for evaluation following cardiac arrest. Patient did have return of circulation prior to ED arrival. Patient maintained with [facility] airway ventilation and pressors pending family consultation. Hypoglycemia was addressed with D50 pending family discussion. Discussed with multiple family members seriousness of patient's prognosis given her underlying health problems and metastatic cancer. Upon multiple discussions with patient's family and consultation with intensivist decision was made to transition patient to comfort care. Pressors were discontinued and endotracheal tube was discontinued. Patient was given morphine for air hunger.   Time of death called at [place],  MD 04/02/14 [phone]

## 2014-04-06 NOTE — ED Notes (Signed)
Extubated at 1321

## 2014-04-06 NOTE — ED Notes (Signed)
Unable to obtain O2 reading, RT at bedside to obtain ABG

## 2014-04-06 NOTE — ED Notes (Signed)
Family at bedside. 

## 2014-04-06 NOTE — ED Notes (Signed)
Unable to obtain O2 reading, MD aware

## 2014-04-06 NOTE — Progress Notes (Signed)
Patient ID: Erica Lowe, female   DOB: 1940/12/10, 74 y.o.   MRN: [id number]    HISTORY AND PHYSICAL  Location:    [facility]     Place of Service:   SNF  Extended Emergency Contact Information Primary Emergency Contact: Rishel-Young,Angela Address: [place] Phone: [phone] Relation: Daughter Secondary Emergency Contact: Mila [place], [place] [address] [address] Phone: [phone] Relation: Son  Advanced Directive information  FULL code; MOST form on chart  Chief Complaint  Patient presents with  . New Admit To SNF    metastatic colon CA, severe protein calorie malnutrition, FTT, intractable pain    HPI:  74 yo female seen today as a new admission to SNF for above. Nursing concerned about inability to care for pt properly due to her uncontrolled pain. Pt cries out even when rolling pt to change positions or providing hygiene care. She is not eating or drinking. Erica Lowe is present. Unable to obtain hx due to pt's mental status. She is moaning in bed and does not repsond to questions.  Past Medical History  Diagnosis Date  . Cancer     Past Surgical History  Procedure Laterality Date  . Cyst excision      Patient Care Team: Willey Blade, MD as PCP - General (Internal Medicine)  History   Social History  . Marital Status: Widowed    Spouse Name: N/A    Number of Children: N/A  . Years of Education: N/A   Occupational History  . Not on file.   Social History Main Topics  . Smoking status: Never Smoker   . Smokeless tobacco: Not on file  . Alcohol Use: No  . Drug Use: Not on file  . Sexual Activity: Not on file   Other Topics Concern  . Not on file   Social History Narrative     reports that she has never smoked. She does not have any smokeless tobacco history on file. She reports that she does not drink alcohol. Her drug history is not on  file.  No family history on file. No family status information on file.     There is no immunization history on file for this patient.  No Known Allergies  Medications: Patient's Medications  New Prescriptions   No medications on file  Previous Medications   ASCORBIC ACID (VITAMIN C PO)    Take 1 tablet by mouth daily.   CYANOCOBALAMIN (VITAMIN B-12 PO)    Take 1 tablet by mouth daily.   IRON PO    Take 1 tablet by mouth daily.   NITROGLYCERIN (NITROLINGUAL) 0.4 MG/SPRAY SPRAY    Place 1 spray under the tongue every 5 (five) minutes x 3 doses as needed for chest pain.   OMEGA-3 FATTY ACIDS (FISH OIL PO)    Take 1 capsule by mouth daily.   RIVAROXABAN (XARELTO) 20 MG TABS TABLET    Take 20 mg by mouth daily with supper.  Modified Medications   No medications on file  Discontinued Medications   No medications on file    Review of Systems  Unable to obtain as pt is moaning in bed and not answering questions  Filed Vitals:   03/19/14 0039  BP: 128/74  Pulse: 94  Temp: 98.1 F (36.7 C)  SpO2: 96%   There is no weight on  file to calculate BMI.  Physical Exam CONSTITUTIONAL: Looks frail and is confused in no acute respiratory distress. Cachexic appearing. Lethargic HEENT: PERRLA. OU scleral icterus. MMdry  NECK: TTP but no palpable cervical or supraclavicular lymph nodes. No carotid bruit b/l.  CVS: Tachycardic without murmur, gallop or rub. LUNGS: anteriorly clear with no wheezing, rales or rhonchi. Right ACW port-a-cath intact and without signs of secondary infection at insertion site ABDOMEN: Bowel sounds present. Generally TTP with guarding. Colostomy intact. Nondistended. No hepatomegaly EXTREMITIES: +1 pitting LE edema b/l. Distal pulses palpable. No calf tenderness PSYCH: moaning and grimacing GU: foley intact with red urine DTG   Labs reviewed: No visits with results within 3 Month(s) from this visit. Latest known visit with results is:  Admission on  04/02/2013, Discharged on 04/03/2013  Component Date Value Ref Range Status  . WBC 04/02/2013 2.7* 4.0 - 10.5 K/uL Final  . RBC 04/02/2013 3.26* 3.87 - 5.11 MIL/uL Final  . Hemoglobin 04/02/2013 9.4* 12.0 - 15.0 g/dL Final  . HCT 04/02/2013 28.8* 36.0 - 46.0 % Final  . MCV 04/02/2013 88.3  78.0 - 100.0 fL Final  . MCH 04/02/2013 28.8  26.0 - 34.0 pg Final  . MCHC 04/02/2013 32.6  30.0 - 36.0 g/dL Final  . RDW 04/02/2013 14.8  11.5 - 15.5 % Final  . Platelets 04/02/2013 246  150 - 400 K/uL Final  . Sodium 04/02/2013 136* 137 - 147 mEq/L Final  . Potassium 04/02/2013 3.8  3.7 - 5.3 mEq/L Final  . Chloride 04/02/2013 97  96 - 112 mEq/L Final  . CO2 04/02/2013 26  19 - 32 mEq/L Final  . Glucose, Bld 04/02/2013 102* 70 - 99 mg/dL Final  . BUN 04/02/2013 15  6 - 23 mg/dL Final  . Creatinine, Ser 04/02/2013 0.87  0.50 - 1.10 mg/dL Final  . Calcium 04/02/2013 9.4  8.4 - 10.5 mg/dL Final  . Total Protein 04/02/2013 7.0  6.0 - 8.3 g/dL Final  . Albumin 04/02/2013 3.0* 3.5 - 5.2 g/dL Final  . AST 04/02/2013 22  0 - 37 U/L Final  . ALT 04/02/2013 14  0 - 35 U/L Final  . Alkaline Phosphatase 04/02/2013 125* 39 - 117 U/L Final  . Total Bilirubin 04/02/2013 0.3  0.3 - 1.2 mg/dL Final  . GFR calc non Af Amer 04/02/2013 65* >90 mL/min Final  . GFR calc Af Amer 04/02/2013 75* >90 mL/min Final   Comment: (NOTE)                          The eGFR has been calculated using the CKD EPI equation.                          This calculation has not been validated in all clinical situations.                          eGFR's persistently <90 mL/min signify possible Chronic Kidney                          Disease.  . Color, Urine 04/03/2013 YELLOW  YELLOW Final  . APPearance 04/03/2013 CLOUDY* CLEAR Final  . Specific Gravity, Urine 04/03/2013 1.007  1.005 - 1.030 Final  . pH 04/03/2013 7.0  5.0 - 8.0 Final  . Glucose, UA 04/03/2013 NEGATIVE  NEGATIVE mg/dL Final  . Hgb  urine dipstick 04/03/2013 NEGATIVE   NEGATIVE Final  . Bilirubin Urine 04/03/2013 NEGATIVE  NEGATIVE Final  . Ketones, ur 04/03/2013 NEGATIVE  NEGATIVE mg/dL Final  . Protein, ur 04/03/2013 NEGATIVE  NEGATIVE mg/dL Final  . Urobilinogen, UA 04/03/2013 0.2  0.0 - 1.0 mg/dL Final  . Nitrite 04/03/2013 NEGATIVE  NEGATIVE Final  . Leukocytes, UA 04/03/2013 SMALL* NEGATIVE Final  . Troponin i, poc 04/02/2013 0.00  0.00 - 0.08 ng/mL Final  . Comment 3 04/02/2013          Final   Comment: Due to the release kinetics of cTnI,                          a negative result within the first hours                          of the onset of symptoms does not rule out                          myocardial infarction with certainty.                          If myocardial infarction is still suspected,                          repeat the test at appropriate intervals.  . Squamous Epithelial / LPF 04/03/2013 FEW* RARE Final  . WBC, UA 04/03/2013 3-6  <3 WBC/hpf Final  . RBC / HPF 04/03/2013 0-2  <3 RBC/hpf Final  . Bacteria, UA 04/03/2013 FEW* RARE Final  . Specimen Description 04/03/2013 URINE, CLEAN CATCH   Final  . Special Requests 04/03/2013 CX ADDED AT 0119 ON [id number]   Final  . Culture  Setup Time 04/03/2013    Final                   Value:04/03/2013 02:04                         Performed at [facility]  . Colony Count 04/03/2013    Final                   Value:15,000 COLONIES/ML                         Performed at [facility]  . Culture 04/03/2013    Final                   Value:Multiple bacterial morphotypes present, none predominant. Suggest appropriate recollection if clinically indicated.                         Performed at [facility]  . Report Status 04/03/2013 04/04/2013 FINAL   Final  . Troponin i, poc 04/03/2013 0.01  0.00 - 0.08 ng/mL Final  . Comment 3 04/03/2013          Final   Comment: Due to the release kinetics of cTnI,                          a negative result within the first hours  of the onset of symptoms does not rule out                          myocardial infarction with certainty.                          If myocardial infarction is still suspected,                          repeat the test at appropriate intervals.  . Troponin I 04/03/2013 <0.30  <0.30 ng/mL Final   Comment:                                 Due to the release kinetics of cTnI,                          a negative result within the first hours                          of the onset of symptoms does not rule out                          myocardial infarction with certainty.                          If myocardial infarction is still suspected,                          repeat the test at appropriate intervals.  . Troponin I 04/03/2013 <0.30  <0.30 ng/mL Final   Comment:                                 Due to the release kinetics of cTnI,                          a negative result within the first hours                          of the onset of symptoms does not rule out                          myocardial infarction with certainty.                          If myocardial infarction is still suspected,                          repeat the test at appropriate intervals.  Marland Kitchen MRSA by PCR 04/03/2013 NEGATIVE  NEGATIVE Final   Comment:                                 The GeneXpert MRSA Assay (FDA                          approved for NASAL specimens  only), is one component of a                          comprehensive MRSA colonization                          surveillance program. It is not                          intended to diagnose MRSA                          infection nor to guide or                          monitor treatment for                          MRSA infections.  . WBC 04/03/2013 2.5* 4.0 - 10.5 K/uL Final  . RBC 04/03/2013 3.22* 3.87 - 5.11 MIL/uL Final  . Hemoglobin 04/03/2013 9.3* 12.0 - 15.0 g/dL Final  . HCT 04/03/2013 28.7* 36.0 - 46.0 %  Final  . MCV 04/03/2013 89.1  78.0 - 100.0 fL Final  . MCH 04/03/2013 28.9  26.0 - 34.0 pg Final  . MCHC 04/03/2013 32.4  30.0 - 36.0 g/dL Final  . RDW 04/03/2013 15.0  11.5 - 15.5 % Final  . Platelets 04/03/2013 253  150 - 400 K/uL Final  . Neutrophils Relative % 04/03/2013 31* 43 - 77 % Final  . Lymphocytes Relative 04/03/2013 65* 12 - 46 % Final  . Monocytes Relative 04/03/2013 2* 3 - 12 % Final  . Eosinophils Relative 04/03/2013 2  0 - 5 % Final  . Basophils Relative 04/03/2013 0  0 - 1 % Final  . Neutro Abs 04/03/2013 0.8* 1.7 - 7.7 K/uL Final  . Lymphs Abs 04/03/2013 1.5  0.7 - 4.0 K/uL Final  . Monocytes Absolute 04/03/2013 0.1  0.1 - 1.0 K/uL Final  . Eosinophils Absolute 04/03/2013 0.1  0.0 - 0.7 K/uL Final  . Basophils Absolute 04/03/2013 0.0  0.0 - 0.1 K/uL Final  . RBC Morphology 04/03/2013 ELLIPTOCYTES   Final  . Sodium 04/03/2013 142  137 - 147 mEq/L Final  . Potassium 04/03/2013 4.1  3.7 - 5.3 mEq/L Final  . Chloride 04/03/2013 101  96 - 112 mEq/L Final  . CO2 04/03/2013 28  19 - 32 mEq/L Final  . Glucose, Bld 04/03/2013 103* 70 - 99 mg/dL Final  . BUN 04/03/2013 15  6 - 23 mg/dL Final  . Creatinine, Ser 04/03/2013 0.88  0.50 - 1.10 mg/dL Final  . Calcium 04/03/2013 9.3  8.4 - 10.5 mg/dL Final  . Total Protein 04/03/2013 6.9  6.0 - 8.3 g/dL Final  . Albumin 04/03/2013 2.9* 3.5 - 5.2 g/dL Final  . AST 04/03/2013 19  0 - 37 U/L Final  . ALT 04/03/2013 13  0 - 35 U/L Final  . Alkaline Phosphatase 04/03/2013 123* 39 - 117 U/L Final  . Total Bilirubin 04/03/2013 0.3  0.3 - 1.2 mg/dL Final  . GFR calc non Af Amer 04/03/2013 64* >90 mL/min Final  . GFR calc Af Amer 04/03/2013 74* >90 mL/min Final   Comment: (NOTE)  The eGFR has been calculated using the CKD EPI equation.                          This calculation has not been validated in all clinical situations.                          eGFR's persistently <90 mL/min signify possible Chronic Kidney                           Disease.  . Path Review 04/03/2013 Normocytic anemia   Final   Comment: Neutropenia                          Reviewed by Mali R. Rund, M.D.                          04/07/2013    No results found.  [facility] hospital records reviewed. Pt's family has declined hospice care in the past when offered by heme/onc.  Past medical, surgical, family and social history reviewed  Assessment/Plan    ICD-9-CM ICD-10-CM   1. Intractable pain 780.96 R52   2. Colon cancer metastasized to multiple sites 153.9 C18.9   3. Protein-calorie malnutrition, severe 262 E43    --will need to address code status with family. Per grandson, his aunt, Levada Dy (pt's daughter) is the POA. He will have her call the facility. An honest discussion about her care needs to be done with emphasis on palliative care followed by hospice due to her terminal state.   --needs better pain control but family has refused any further tx of patient's pain. Note that SNFstaff (including PT/OT) is having difficulty caring for pt due to her intractable pain.  --continue current medications as ordered. PT/OT as tolerated    Waynette Towers S. Perlie Gold  [facility] [address] [place], [place] [address] [phone] Office (Wednesdays and Fridays 8 AM - 5 PM) [phone] Cell (Monday-Friday 8 AM - 5 PM)

## 2014-04-06 NOTE — ED Notes (Signed)
RT at bedside to complete extubation

## 2014-04-06 NOTE — ED Notes (Signed)
Per MD Nelda Marseille, family has reached decision to remove breathing tube and provide comfort care for end of life. Family will be brought to bedside to say their goodbyes, will notify staff when they are ready to remove breathing tube.

## 2014-04-06 NOTE — ED Notes (Signed)
MD Ralene Bathe to bedside

## 2014-04-06 NOTE — ED Notes (Signed)
Pt in via EMS, found unresponsive, pulseless, CPR initiated by staff, on EMS arrival no shock indicated, PEA on monitor, given epi x1, king airway in place, CPR x7 min by EMS, unknown time prior to EMS arrival, ROSC achieved, ST on monitor, remains unresponsive, IO in place, unknown downtime prior to status being discovered by nursing home staff- pt from [place] living at [facility]

## 2014-04-06 NOTE — ED Notes (Signed)
Pt in asystole on monitor, MD notified, family remains at bedside

## 2014-04-06 NOTE — ED Notes (Signed)
Ralene Bathe, MD notified of abnormal lab test results

## 2014-04-06 NOTE — ED Notes (Signed)
Family remains at bedside, agonal respirations noted, pt BP continues to decrease.

## 2014-04-06 NOTE — Progress Notes (Signed)
Chaplain responded to post-CPR patient.  Assisted coordination of family members to visit patient and to gather in the waiting room.  Helped facilitate phone call to patient's son who is incarcerated. Son was able to speak to patient and his own daughter, patient's granddaughter.  Offered prayer and grief support.  Please call chaplain if further support is needed.  Luana Shu [phone]    Apr 03, 2014 1200  Clinical Encounter Type  Visited With Patient and family together  Visit Type Patient actively dying  Referral From Nurse  Spiritual Encounters  Spiritual Needs Grief support  Stress Factors  Family Stress Factors Major life changes

## 2014-04-06 NOTE — ED Notes (Addendum)
Time of death 1 per MD Ralene Bathe

## 2014-04-06 NOTE — ED Notes (Signed)
Attempting to access port at this time

## 2014-04-06 NOTE — ED Notes (Signed)
Pt has been extubated, family remain at bedside

## 2014-04-06 NOTE — ED Notes (Signed)
Family at bedside, preparing for extubation, family will notify when they are ready

## 2014-04-06 NOTE — ED Notes (Signed)
Family to bedside, MD Ralene Bathe to bedside to discuss code status

## 2014-04-06 NOTE — Progress Notes (Signed)
Patient extubated per MD's order. 

## 2014-04-06 NOTE — Progress Notes (Signed)
Patient came in intubated by EMS with a [place] airway ED and ICU physician wanted to keep patient intubated with the king airway and placed on vent, ABG obtained showing patient oxygenating  and ventilating well. Will continue to monitor patient.

## 2014-04-06 NOTE — ED Notes (Signed)
At this time no change in code status but family is actively discussing this issue, intensivist en route to ED to discuss further plan of care with family before any interventions are completed.

## 2014-04-06 DEATH — deceased

## 2015-06-16 IMAGING — CR DG CHEST 2V
2 series · 2 of 2 positions shown · non-contrast
Comparison: 05/09/2010

CLINICAL DATA: Lower chest pain, shortness of breath

EXAM:
CHEST  2 VIEW

[w chest pa]
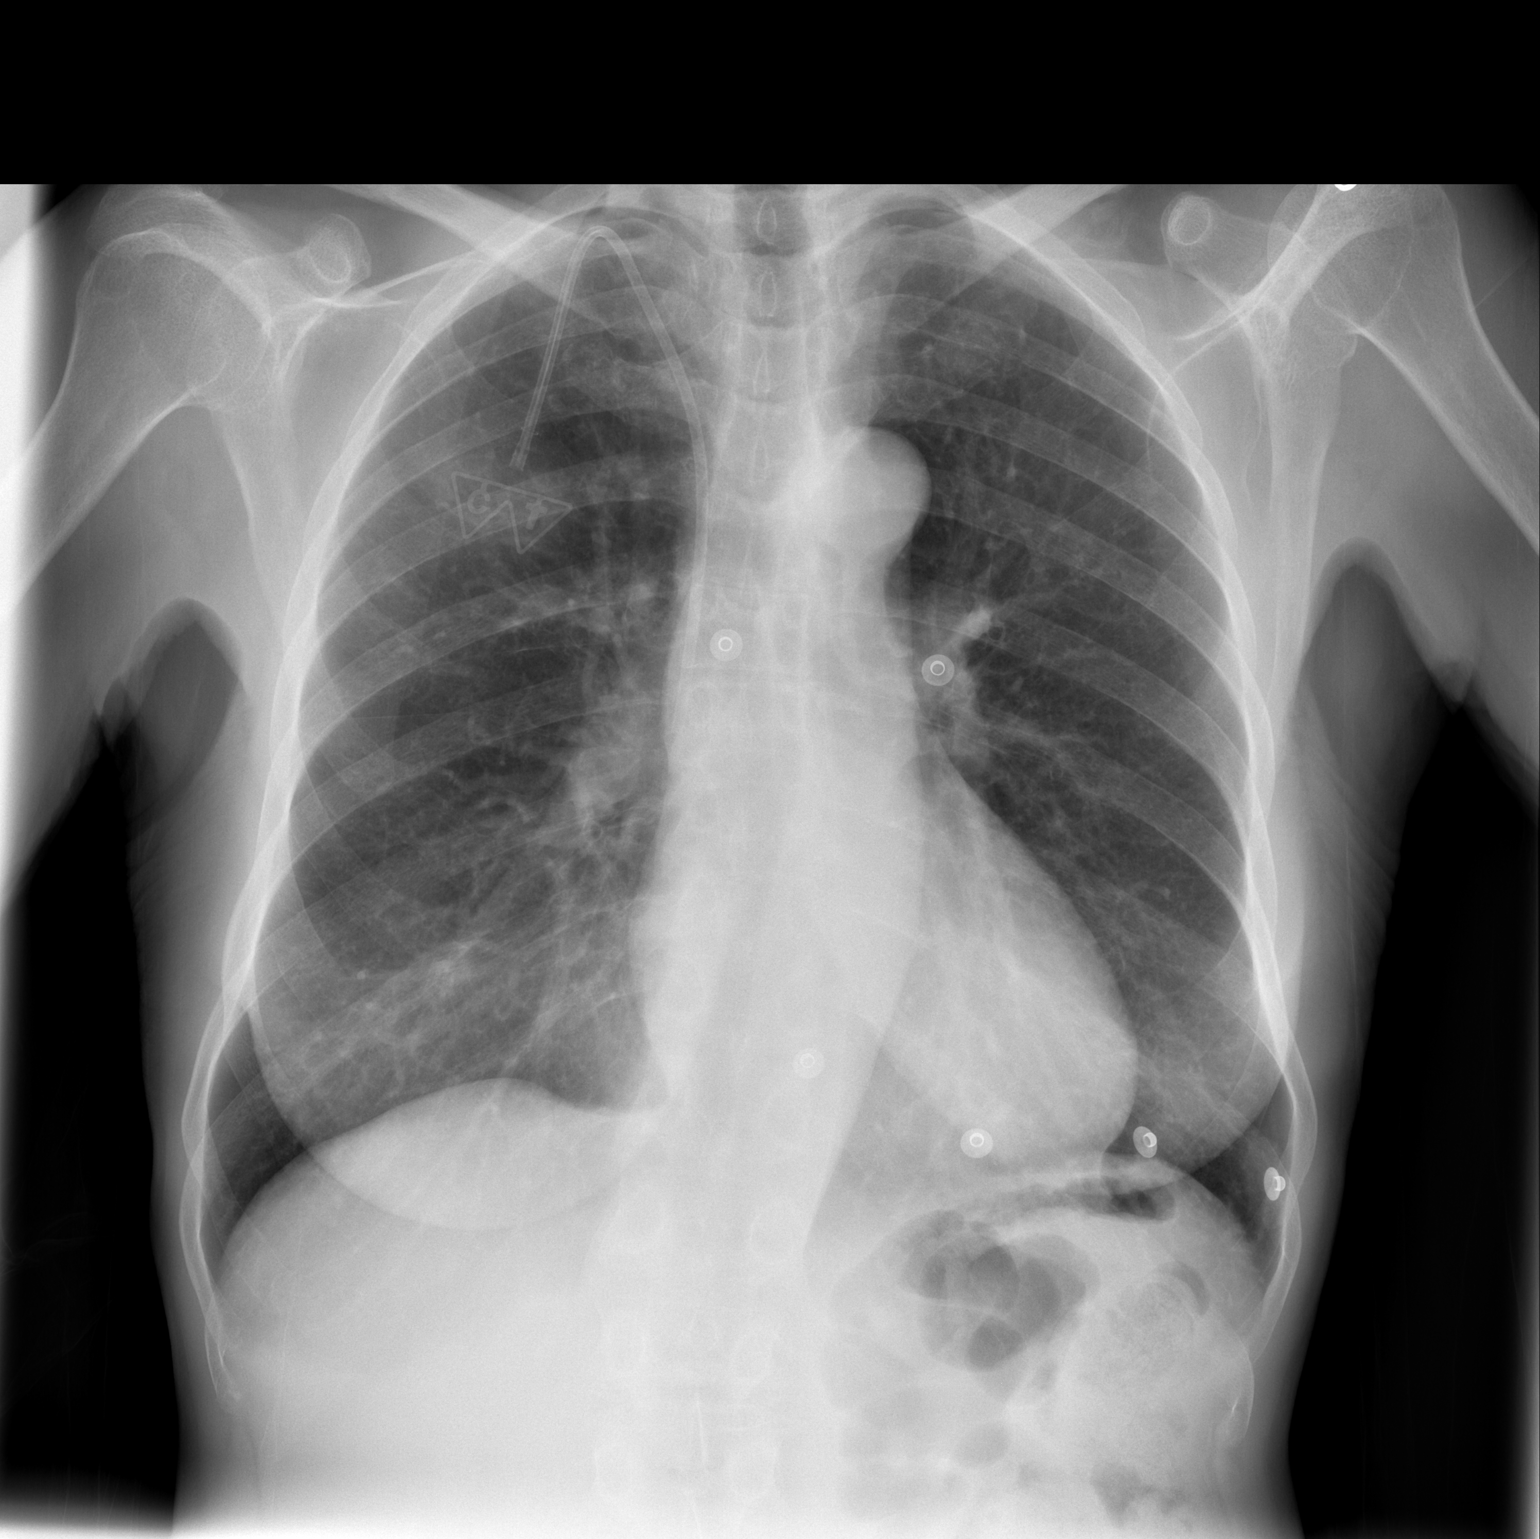

[w chest lat]
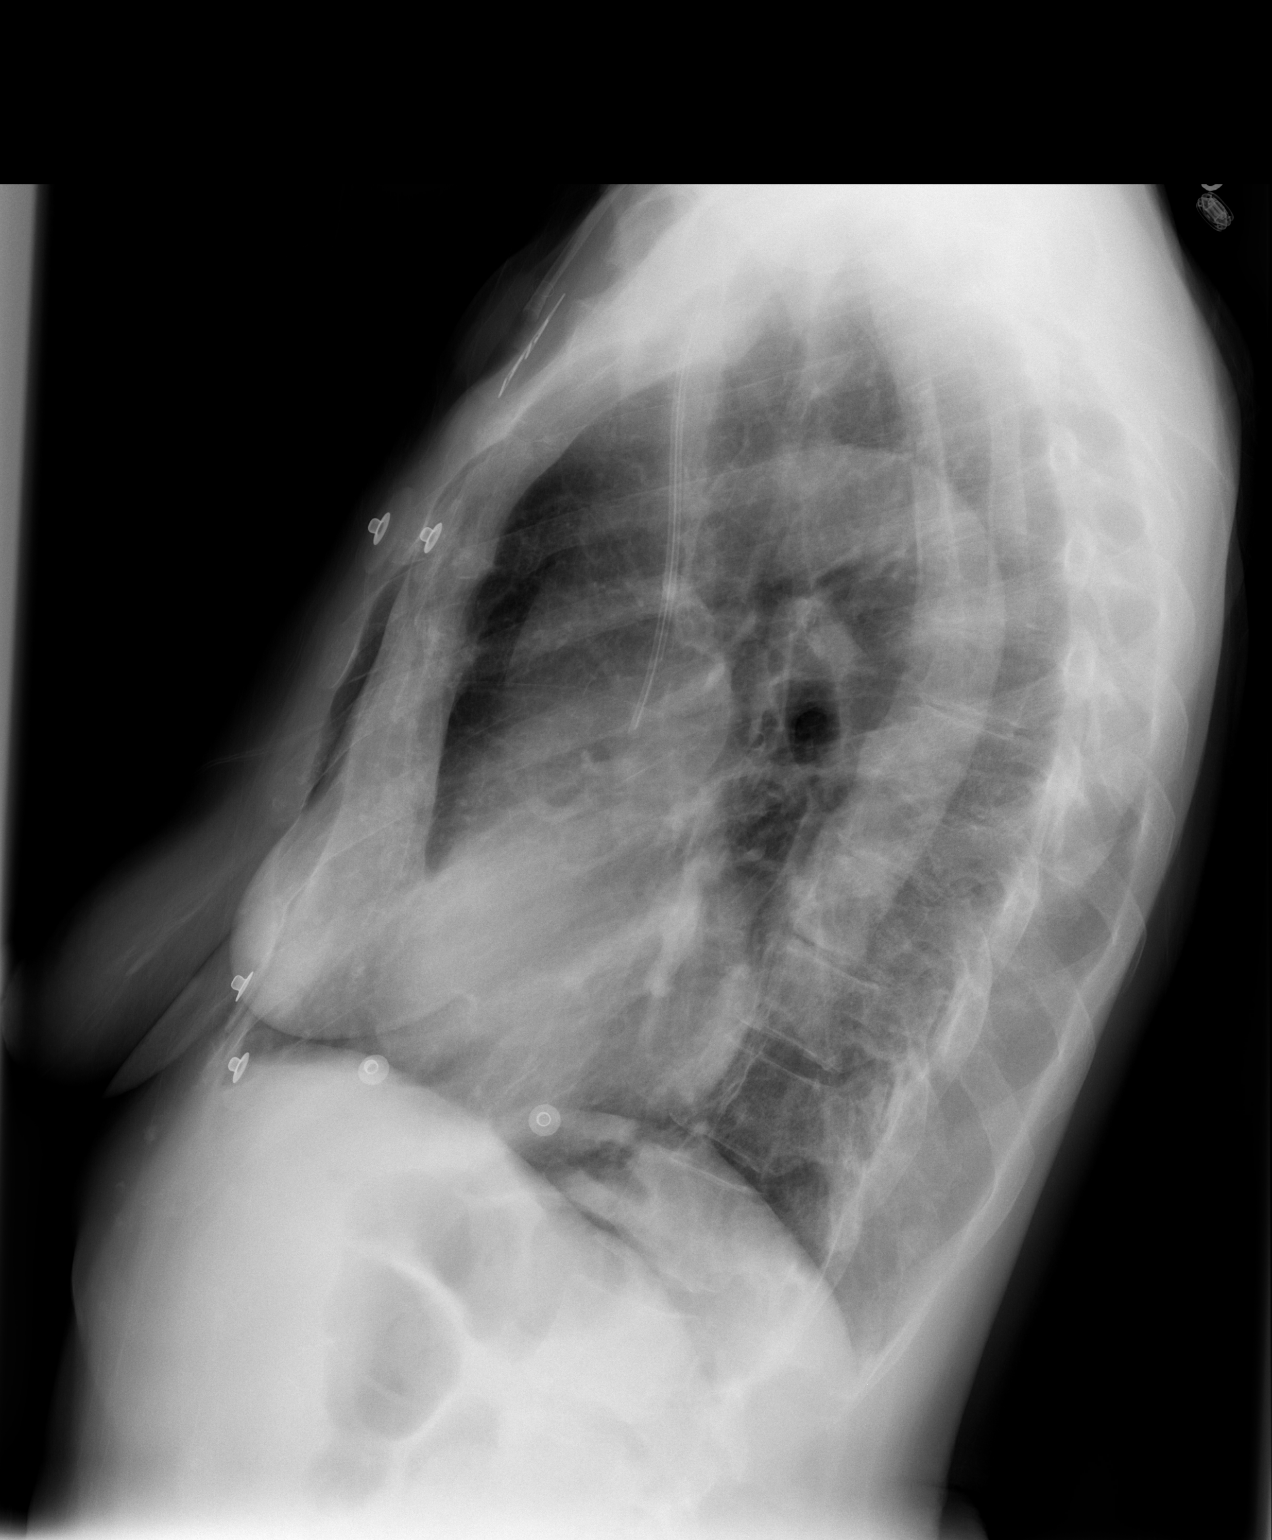

[2 of 2 positions shown; findings below may reference images not displayed]

FINDINGS: Right IJ double lumen power port catheter tip mid SVC. Normal heart
size and vascularity. No acute pneumonia, edema, collapse or
consolidation. No effusion or pneumothorax. Trachea midline.
IMPRESSION: No active cardiopulmonary disease.

## 2015-06-17 IMAGING — CT CT ANGIO CHEST
2 of 9 series · 19 of 46 positions shown · IV contrast (APPLIED)
Comparison: 04/02/2013 chest x-ray

CLINICAL DATA: Shortness of breath, colon cancer, chest pain

EXAM:
CT ANGIOGRAPHY CHEST WITH CONTRAST
TECHNIQUE: Multidetector CT imaging of the chest was performed using the
standard protocol during bolus administration of intravenous
contrast. Multiplanar CT image reconstructions including MIPs were
obtained to evaluate the vascular anatomy.
CONTRAST:  80mL OMNIPAQUE IOHEXOL 350 MG/ML SOLN

[Series 5: thins · axial · 0.60mm/px · z∈[+1094,+1360]mm · 16 of 300 slices shown]
[im 17/300  lung]
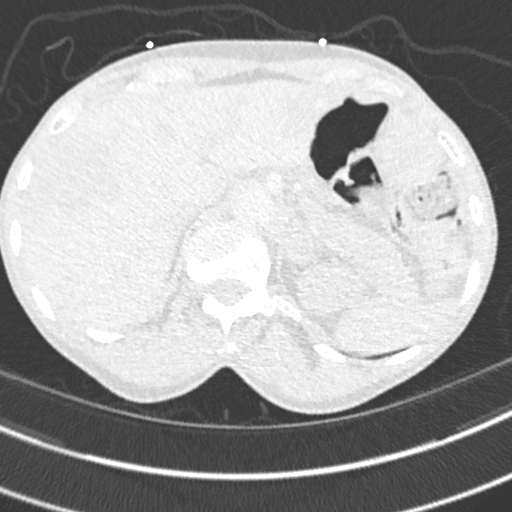
[im 34/300  soft-tissue]
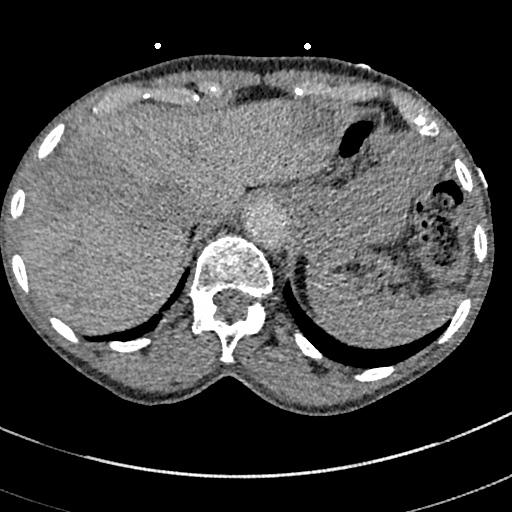
[im 50/300  lung]
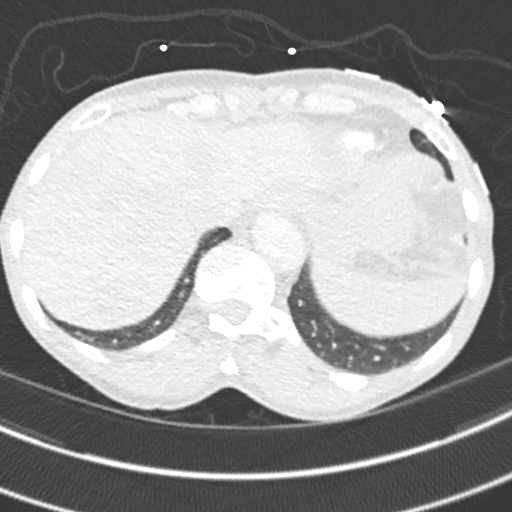
[im 67/300  soft-tissue]
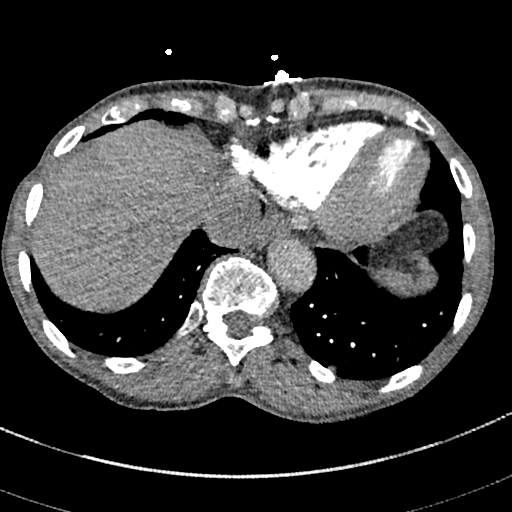
[im 84/300  lung]
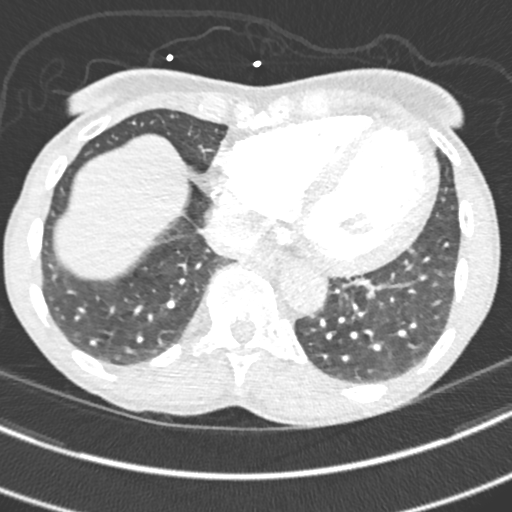
[im 100/300  soft-tissue]
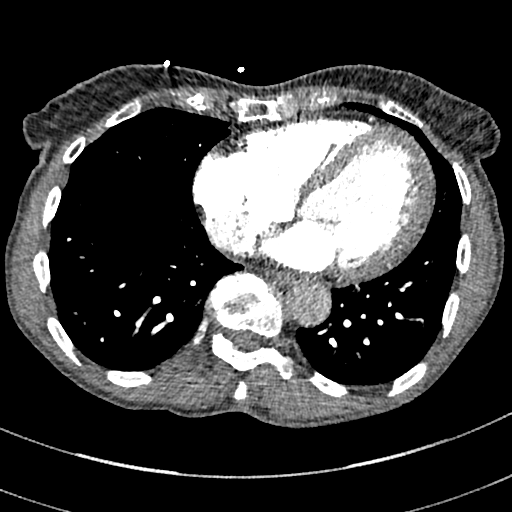
[im 117/300  lung]
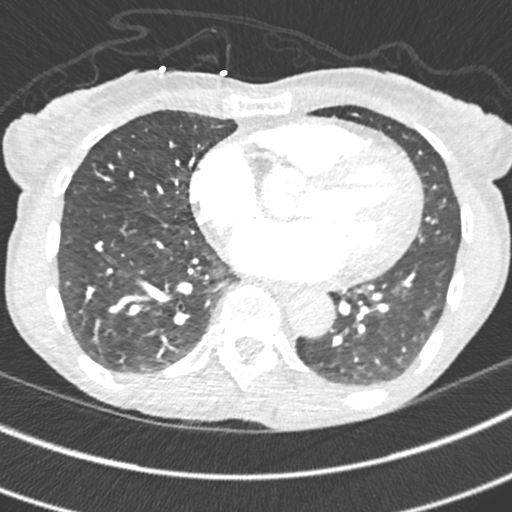
[im 133/300  soft-tissue]
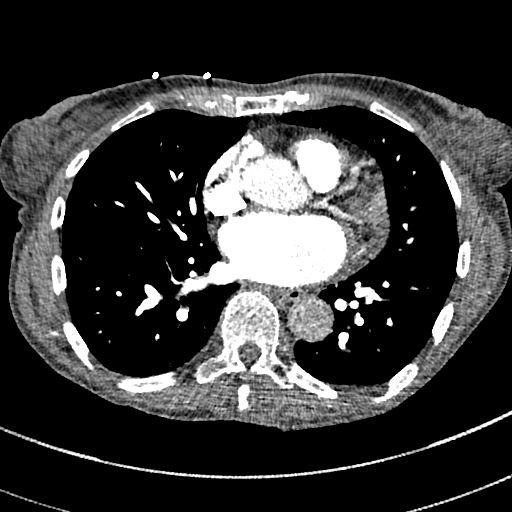
[im 167/300  lung]
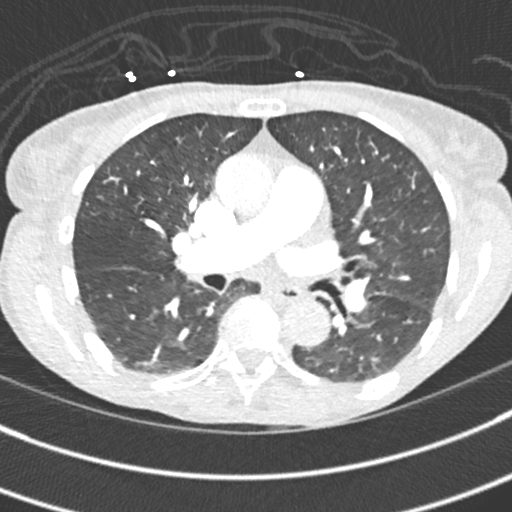
[im 183/300  soft-tissue]
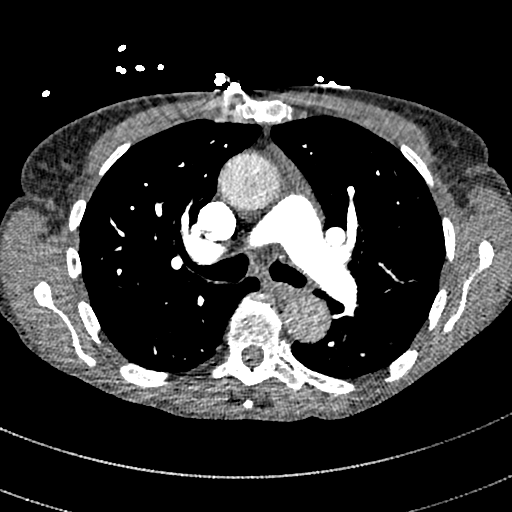
[im 200/300  lung]
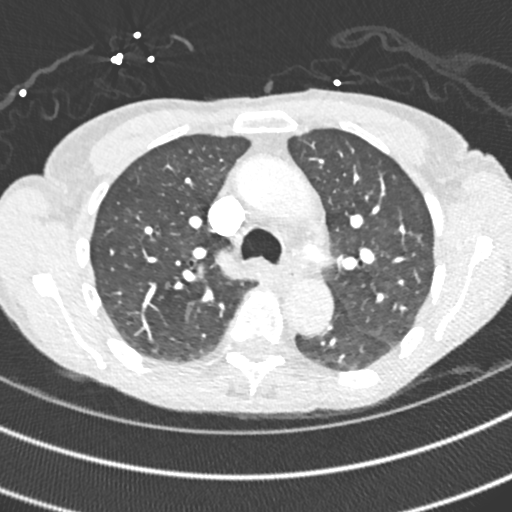
[im 216/300  soft-tissue]
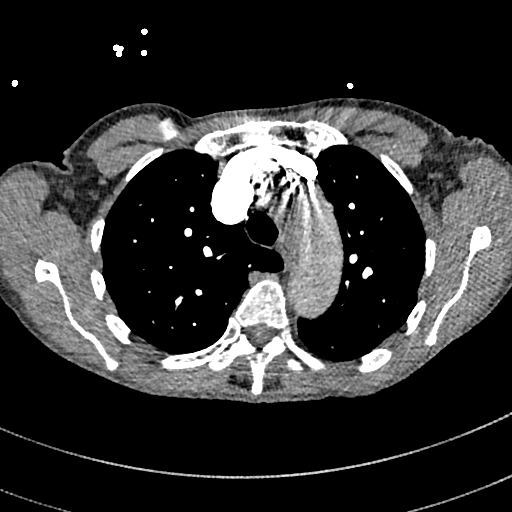
[im 233/300  lung]
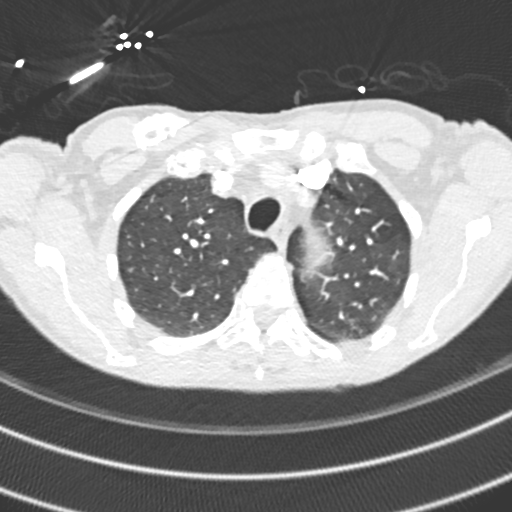
[im 250/300  soft-tissue]
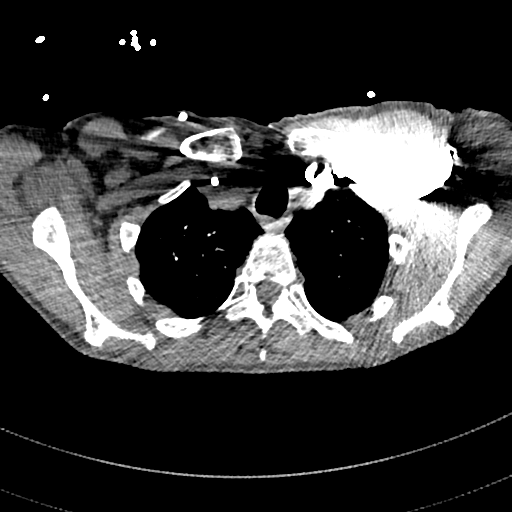
[im 266/300  lung]
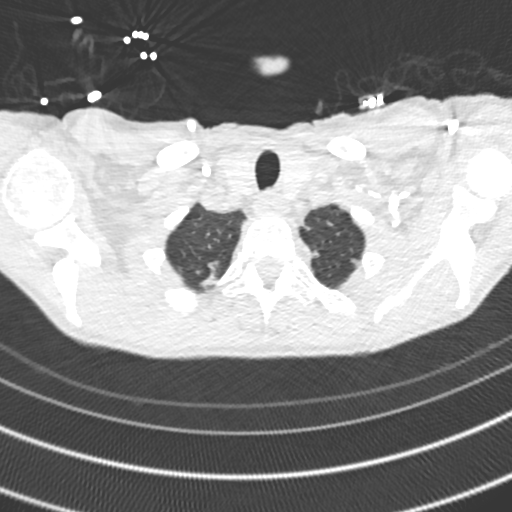
[im 283/300  soft-tissue]
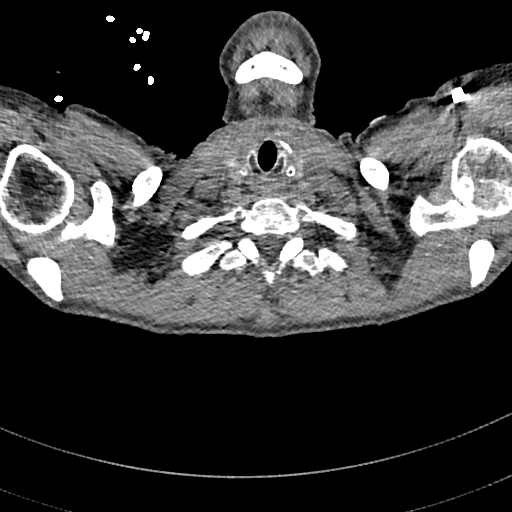

[Series 7: coronal mpr · coronal · 0.59mm/px · 3 of 96 slices shown]
[im 24/96  soft-tissue]
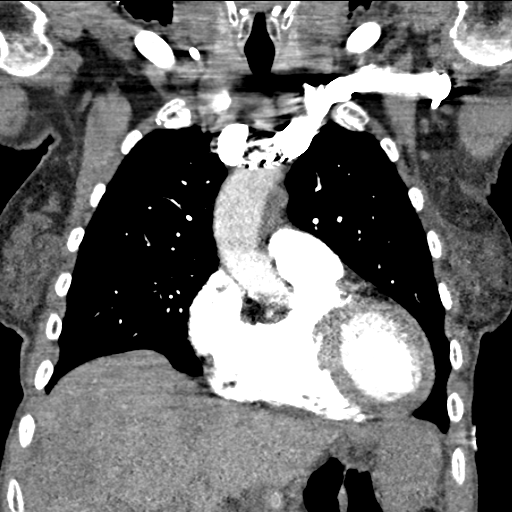
[im 48/96  soft-tissue]
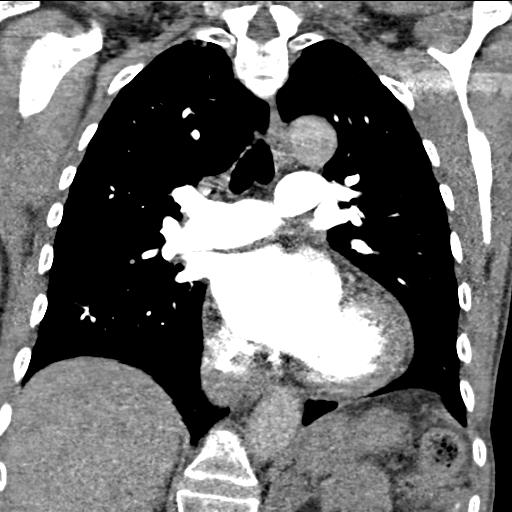
[im 72/96  soft-tissue]
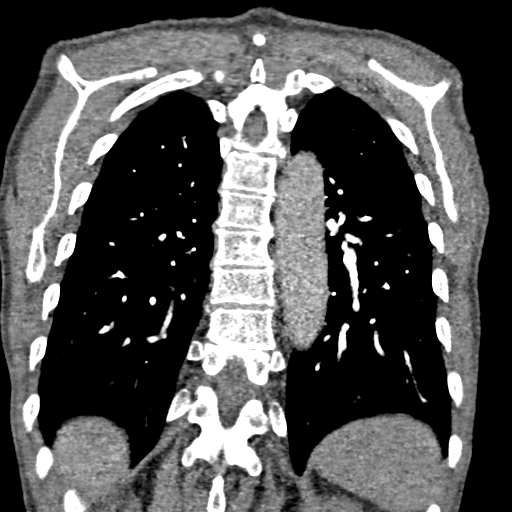

[19 of 46 positions shown; findings below may reference images not displayed]

FINDINGS: Pulmonary arteries demonstrate no filling defect or significant
pulmonary embolus by CTA. No thoracic aneurysm. Negative for
adenopathy. Right IJ double-lumen port catheter tip in the SVC.
Normal heart size. No pericardial or pleural effusion.

Lung windows demonstrate mild emphysema. No focal airspace process,
collapse or consolidation. No interstitial process or edema. No
pneumothorax. Trachea and central airways are patent. Left lower
lobe posterior subpleural 10 mm nodule noted, image 74. This remains
indeterminate. Minor basilar atelectasis.

Included upper abdomen demonstrates hypodense lesions throughout the
liver compatible with metastatic disease. Left adrenal mass noted,
compatible with an adrenal metastasis.

Minor degenerative changes of the spine.  No compression fracture.

Review of the MIP images confirms the above findings.
IMPRESSION: No significant acute pulmonary embolus by CTA.

No acute intra thoracic finding

Hyperinflation with mild emphysema

10 mm posterior subpleural left lower lobe nodule, indeterminate but
suspicious for a small pulmonary metastasis.

Partial imaging of hypodense hepatic lesions and a left adrenal mass
concerning for metastatic disease in the upper abdomen.
